# Patient Record
Sex: Female | Born: 1985 | Race: White | Hispanic: No | Marital: Single | Smoking: Current every day smoker
Health system: Southern US, Community
[De-identification: ages and names within clinical notes are randomized; demographics above are authoritative.]

## PROBLEM LIST (undated history)

## (undated) ENCOUNTER — Inpatient Hospital Stay (HOSPITAL_COMMUNITY): Payer: Self-pay

## (undated) DIAGNOSIS — E119 Type 2 diabetes mellitus without complications: Secondary | ICD-10-CM

## (undated) DIAGNOSIS — Z789 Other specified health status: Secondary | ICD-10-CM

## (undated) HISTORY — PX: WISDOM TOOTH EXTRACTION: SHX21

## (undated) HISTORY — DX: Other specified health status: Z78.9

## (undated) HISTORY — PX: DILATION AND CURETTAGE OF UTERUS: SHX78

## (undated) HISTORY — DX: Type 2 diabetes mellitus without complications: E11.9

---
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
## 2006-01-12 ENCOUNTER — Ambulatory Visit: Payer: Self-pay | Admitting: Sports Medicine

## 2006-02-14 ENCOUNTER — Ambulatory Visit: Payer: Self-pay | Admitting: Sports Medicine

## 2006-03-26 ENCOUNTER — Emergency Department (HOSPITAL_COMMUNITY): Admission: EM | Admit: 2006-03-26 | Discharge: 2006-03-26 | Payer: Self-pay | Admitting: Emergency Medicine

## 2006-03-31 ENCOUNTER — Other Ambulatory Visit: Admission: RE | Admit: 2006-03-31 | Discharge: 2006-03-31 | Payer: Self-pay | Admitting: Family Medicine

## 2006-03-31 ENCOUNTER — Ambulatory Visit: Payer: Self-pay | Admitting: Family Medicine

## 2006-03-31 ENCOUNTER — Encounter (INDEPENDENT_AMBULATORY_CARE_PROVIDER_SITE_OTHER): Payer: Self-pay | Admitting: Family Medicine

## 2006-03-31 ENCOUNTER — Encounter (INDEPENDENT_AMBULATORY_CARE_PROVIDER_SITE_OTHER): Payer: Self-pay | Admitting: Specialist

## 2006-03-31 LAB — CONVERTED CEMR LAB
Chlamydia, DNA Probe: NEGATIVE
GC Probe Amp, Genital: NEGATIVE

## 2006-04-28 DIAGNOSIS — J309 Allergic rhinitis, unspecified: Secondary | ICD-10-CM | POA: Insufficient documentation

## 2006-04-28 DIAGNOSIS — G44209 Tension-type headache, unspecified, not intractable: Secondary | ICD-10-CM

## 2006-06-15 ENCOUNTER — Ambulatory Visit: Payer: Self-pay | Admitting: Family Medicine

## 2006-09-22 ENCOUNTER — Encounter (INDEPENDENT_AMBULATORY_CARE_PROVIDER_SITE_OTHER): Payer: Self-pay | Admitting: Family Medicine

## 2006-09-22 ENCOUNTER — Telehealth: Payer: Self-pay | Admitting: *Deleted

## 2006-09-22 ENCOUNTER — Ambulatory Visit: Payer: Self-pay | Admitting: Family Medicine

## 2006-09-22 DIAGNOSIS — N898 Other specified noninflammatory disorders of vagina: Secondary | ICD-10-CM | POA: Insufficient documentation

## 2006-09-22 LAB — CONVERTED CEMR LAB
Chlamydia, DNA Probe: NEGATIVE
Whiff Test: NEGATIVE

## 2006-10-05 ENCOUNTER — Encounter (INDEPENDENT_AMBULATORY_CARE_PROVIDER_SITE_OTHER): Payer: Self-pay | Admitting: Family Medicine

## 2006-11-01 ENCOUNTER — Ambulatory Visit: Payer: Self-pay | Admitting: Family Medicine

## 2006-11-01 DIAGNOSIS — M545 Low back pain: Secondary | ICD-10-CM

## 2006-12-14 ENCOUNTER — Ambulatory Visit: Payer: Self-pay | Admitting: Family Medicine

## 2007-08-21 ENCOUNTER — Encounter (INDEPENDENT_AMBULATORY_CARE_PROVIDER_SITE_OTHER): Payer: Self-pay | Admitting: Family Medicine

## 2007-11-21 ENCOUNTER — Telehealth: Payer: Self-pay | Admitting: *Deleted

## 2007-12-15 ENCOUNTER — Telehealth: Payer: Self-pay | Admitting: *Deleted

## 2007-12-20 ENCOUNTER — Telehealth (INDEPENDENT_AMBULATORY_CARE_PROVIDER_SITE_OTHER): Payer: Self-pay | Admitting: *Deleted

## 2008-02-27 ENCOUNTER — Emergency Department (HOSPITAL_COMMUNITY): Admission: EM | Admit: 2008-02-27 | Discharge: 2008-02-27 | Payer: Self-pay | Admitting: Family Medicine

## 2008-04-17 ENCOUNTER — Telehealth: Payer: Self-pay | Admitting: *Deleted

## 2008-09-05 ENCOUNTER — Encounter: Payer: Self-pay | Admitting: Family Medicine

## 2008-09-05 ENCOUNTER — Encounter (INDEPENDENT_AMBULATORY_CARE_PROVIDER_SITE_OTHER): Payer: Self-pay | Admitting: Family Medicine

## 2008-09-05 ENCOUNTER — Ambulatory Visit: Payer: Self-pay | Admitting: Family Medicine

## 2008-09-05 DIAGNOSIS — N912 Amenorrhea, unspecified: Secondary | ICD-10-CM

## 2008-09-05 DIAGNOSIS — E663 Overweight: Secondary | ICD-10-CM | POA: Insufficient documentation

## 2008-09-05 DIAGNOSIS — F17200 Nicotine dependence, unspecified, uncomplicated: Secondary | ICD-10-CM | POA: Insufficient documentation

## 2008-09-05 LAB — CONVERTED CEMR LAB
HCT: 36.8 % (ref 36.0–46.0)
Hemoglobin: 12.1 g/dL (ref 12.0–15.0)
LDL Cholesterol: 81 mg/dL (ref 0–99)
MCHC: 32.9 g/dL (ref 30.0–36.0)
RDW: 14 % (ref 11.5–15.5)

## 2008-09-06 ENCOUNTER — Encounter: Payer: Self-pay | Admitting: Family Medicine

## 2008-09-09 ENCOUNTER — Encounter: Payer: Self-pay | Admitting: Family Medicine

## 2008-10-08 ENCOUNTER — Telehealth: Payer: Self-pay | Admitting: Family Medicine

## 2008-10-14 ENCOUNTER — Telehealth: Payer: Self-pay | Admitting: Family Medicine

## 2008-10-14 ENCOUNTER — Encounter: Payer: Self-pay | Admitting: Family Medicine

## 2008-10-14 ENCOUNTER — Ambulatory Visit: Payer: Self-pay | Admitting: Family Medicine

## 2008-10-14 DIAGNOSIS — G44229 Chronic tension-type headache, not intractable: Secondary | ICD-10-CM

## 2008-12-03 ENCOUNTER — Ambulatory Visit: Payer: Self-pay | Admitting: Family Medicine

## 2009-01-06 ENCOUNTER — Ambulatory Visit: Payer: Self-pay | Admitting: Family Medicine

## 2009-01-06 DIAGNOSIS — R519 Headache, unspecified: Secondary | ICD-10-CM | POA: Insufficient documentation

## 2009-01-06 DIAGNOSIS — R51 Headache: Secondary | ICD-10-CM

## 2009-05-07 ENCOUNTER — Telehealth: Payer: Self-pay | Admitting: Family Medicine

## 2009-06-12 ENCOUNTER — Ambulatory Visit: Payer: Self-pay | Admitting: Family Medicine

## 2009-06-19 ENCOUNTER — Ambulatory Visit: Payer: Self-pay | Admitting: Family Medicine

## 2009-06-19 ENCOUNTER — Telehealth (INDEPENDENT_AMBULATORY_CARE_PROVIDER_SITE_OTHER): Payer: Self-pay | Admitting: *Deleted

## 2009-06-19 DIAGNOSIS — K089 Disorder of teeth and supporting structures, unspecified: Secondary | ICD-10-CM

## 2010-03-31 NOTE — Assessment & Plan Note (Signed)
Summary: FU/KH   Vital Signs:  Patient profile:   25 year old female Weight:      189.6 pounds Temp:     98 degrees F oral Pulse rate:   89 / minute Pulse rhythm:   regular BP sitting:   130 / 86  (left arm) Cuff size:   regular  Vitals Entered By: Loralee Pacas CMA (June 12, 2009 3:37 PM) Comments pt states that she has increased appetite and weight gain with meds.  also tension headaches on right temple x 3 weeks alleviated with tylenol   Primary Care Provider:  Asher Muir MD   History of Present Illness: 1.  headaches--amitripyline--put on this in november for headaches.  helped the headaches alot, but gained weight.  she thinks about 15 pounds. otherwise was tolerating the medicine well.    Habits & Providers  Alcohol-Tobacco-Diet     Tobacco Status: current     Tobacco Counseling: to quit use of tobacco products     Cigarette Packs/Day: <0.25  Current Medications (verified): 1)  Ortho Tri-Cyclen (28) 0.18/0.215/0.25 Mg-35 Mcg Tabs (Norgestim-Eth Estrad Triphasic) .Marland Kitchen.. 1 Tab By Mouth Daily For Birth Control 2)  Amitriptyline Hcl 25 Mg Tabs (Amitriptyline Hcl) .... Take 1 Tablet At Night For One Week, Then Take 2 Tablets At Night For One Week, Then Take 3 Tablets At Night For The Next 2-3 Months  Allergies: No Known Drug Allergies  Social History: Packs/Day:  <0.25  Review of Systems General:  Denies fever and loss of appetite; wt gain. Neuro:  Denies brief paralysis, disturbances in coordination, and memory loss.  Physical Exam  General:  Well-developed,well-nourished,in no acute distress; alert,appropriate and cooperative throughout examination Neurologic:  alert & oriented X3, cranial nerves II-XII intact, strength normal in all extremities, and finger-to-nose normal.   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  will stop amitryptiline and start propranolol for prophyaxis.    Orders: [facility]- Est Level  3 ([address])  Complete Medication List: 1)  Ortho Tri-cyclen (28) 0.18/0.215/0.25 Mg-35 Mcg Tabs (Norgestim-eth estrad triphasic) .Marland Kitchen.. 1 tab by mouth daily for birth control 2)  Propranolol Hcl 40 Mg Tabs (Propranolol hcl) .... 2 tabs by mouth by mouth two times a day for headache prevention  Patient Instructions: 1)  It was nice to see you today. 2)  For your headaches, STOP the amitryptiline. 3)  START the propranolol I prescribed you.  Take 2 tablets two times a day.   4)  Call me if you have any problems or questions. 5)  Please schedule an appointment for your physical and pap smear in late July. Prescriptions: PROPRANOLOL HCL 40 MG TABS (PROPRANOLOL HCL) 2 tabs by mouth by mouth two times a day for headache prevention  #160 x 6   Entered and Authorized by:   Asher Muir MD   Signed by:   Asher Muir MD on 06/12/2009   Method used:   Print then Give to Patient   RxID:   [id number]   Prevention & Chronic Care Immunizations   Influenza vaccine: Fluvax Non-MCR  (12/03/2008)   Influenza vaccine due: 12/14/2007    Tetanus booster: Not documented    Pneumococcal vaccine: Not documented  Other Screening   Pap smear: NEGATIVE FOR INTRAEPITHELIAL LESIONS OR MALIGNANCY.  (09/05/2008)   Pap smear due: 09/05/2009   Smoking status: current  (06/12/2009)

## 2010-03-31 NOTE — Assessment & Plan Note (Signed)
Summary: Dental pain   Vital Signs:  Patient profile:   25 year old female Weight:      183 pounds  Vitals Entered By: Loralee Pacas CMA (June 19, 2009 3:13 PM)  Primary Care Provider:  Asher Muir MD  CC:  tooth pain.  History of Present Illness: 25yo F w/ tooth pain  Tooth pain: Localized to R upper posterior molar.  States that the tooth broke 2 months ago but has been hurting constantly with sharp pain x 2 weeks.  Currently taking tylenol every 4 hours without significant relief.  No fevers, chills, or drainage from the site.  Pain with chewing so eating has been difficulty.  She has no primary dentist.  Current Medications (verified): 1)  Ortho Tri-Cyclen (28) 0.18/0.215/0.25 Mg-35 Mcg Tabs (Norgestim-Eth Estrad Triphasic) .Marland Kitchen.. 1 Tab By Mouth Daily For Birth Control 2)  Propranolol Hcl 40 Mg Tabs (Propranolol Hcl) .... 2 Tabs By Mouth By Mouth Two Times A Day For Headache Prevention 3)  Ultram 50 Mg Tabs (Tramadol Hcl) .Marland Kitchen.. 1 Tab By Mouth Every 6 Hours As Needed For Breakthrough Pain 4)  Tylenol Extra Strength 500 Mg Tabs (Acetaminophen) .... 2 Tabs By Mouth Q8 Hours Scheduled While Having Tooth Pain 5)  Amoxicillin 500 Mg Tabs (Amoxicillin) .Marland Kitchen.. 1 Tab By Mouth Two Times A Day X 7 Days  Allergies (verified): No Known Drug Allergies  Past History:  Past Medical History: Last updated: 09/05/2008 [id number] - TAB 8/06, 6/10, SVD 8/07 abnormal pap--no invasive procedures--years ago, can't remember when  Review of Systems      See HPI  Physical Exam  General:  VS Reviewed. Well appearing, NAD.  Mouth:  Chipped lateral aspect of the top right posterior molar with moderate signs of dental decay; no surrounding erythema or edema or the gums; no active discharge; no abscess Lungs:  normal respiratory effort, no crackles, and no wheezes or ronchi.   Heart:  normal rate, regular rhythm, no murmur, no gallop, and no rub.     Impression & Recommendations:  Problem # 1:   DENTAL PAIN (ICD-525.9) Assessment New  Dental pain of the top Right posterior molar. No active infection visible and no systemic constitutional symptoms. Plan to cover with amoxicillin. Will treat pain with schedule tylenol 800mg  q8 and ultram 50mg  q6 for breakthrough. Referral sent to urgent dental clinic. Will f/u if she has any signs of infection.  Orders: [facility]- Est Level  3 ([address]86)  Complete Medication List: 1)  Ortho Tri-cyclen (28) 0.18/0.215/0.25 Mg-35 Mcg Tabs (Norgestim-eth estrad triphasic) .Marland Kitchen.. 1 tab by mouth daily for birth control 2)  Propranolol Hcl 40 Mg Tabs (Propranolol hcl) .... 2 tabs by mouth by mouth two times a day for headache prevention 3)  Ultram 50 Mg Tabs (Tramadol hcl) .Marland Kitchen.. 1 tab by mouth every 6 hours as needed for breakthrough pain 4)  Tylenol Extra Strength 500 Mg Tabs (Acetaminophen) .... 2 tabs by mouth q8 hours scheduled while having tooth pain 5)  Amoxicillin 500 Mg Tabs (Amoxicillin) .Marland Kitchen.. 1 tab by mouth two times a day x 7 days  Patient Instructions: 1)  We are referring you to a dentist for further evaluation and treatment.   2)  Return to the clinic if you notice any signs of infection...fevers, chills, pus, drainage, swelling, and redness of the gums. Prescriptions: AMOXICILLIN 500 MG TABS (AMOXICILLIN) 1 tab by mouth two times a day x 7 days  #14 x 0   Entered and Authorized by:  Marisue Ivan  MD   Signed by:   Marisue Ivan  MD on 06/19/2009   Method used:   Print then Give to Patient   RxID:   [id number] ULTRAM 50 MG TABS (TRAMADOL HCL) 1 tab by mouth every 6 hours as needed for breakthrough pain  #30 x 0   Entered and Authorized by:   Marisue Ivan  MD   Signed by:   Marisue Ivan  MD on 06/19/2009   Method used:   Print then Give to Patient   RxID:   [id number]

## 2010-03-31 NOTE — Progress Notes (Signed)
Summary: Rx Req  Phone Note Refill Request Call back at Home Phone [phone] Message from:  Patient  Refills Requested: Medication #1:  AMITRIPTYLINE HCL 25 MG TABS take 1 tablet at night for one week PT USES HEALTH DEPT.  PT HAS F/UP APPT ON 3/15.  Initial call taken by: Clydell Hakim,  May 07, 2009 9:20 AM  Follow-up for Phone Call        will fax script  to health dept. Follow-up by: Asher Muir MD,  May 07, 2009 11:46 AM    Prescriptions: AMITRIPTYLINE HCL 25 MG TABS (AMITRIPTYLINE HCL) take 1 tablet at night for one week, then take 2 tablets at night for one week, then take 3 tablets at night for the next 2-3 months  #90 x 3   Entered and Authorized by:   Asher Muir MD   Signed by:   Asher Muir MD on 05/07/2009   Method used:   Printed then faxed to ...       [facility]  [address] [id number] (retail)       [address]       [place], [place]  [address]       Ph: [phone]       Fax: [phone]   RxID:   [id number]

## 2010-03-31 NOTE — Progress Notes (Signed)
Summary: referral  Phone Note Call from Patient Call back at [phone]   Caller: Patient Summary of Call: wants a referral to Adult Dental Clinic - tooth pain Initial call taken by: De Nurse,  June 19, 2009 10:31 AM  Follow-up for Phone Call        spoke with patient . she broke her tooth 2 months   ago. for 2 weeks she has been having some dental pain , now has worsened. constant pain. appointment scheduled today for [place] appointment today . Follow-up by: Theresia Lo RN,  June 19, 2009 11:06 AM

## 2010-04-13 ENCOUNTER — Encounter: Payer: Self-pay | Admitting: *Deleted

## 2010-12-04 LAB — URINE CULTURE: Colony Count: >=100000

## 2010-12-04 LAB — POCT URINALYSIS DIP (DEVICE)
Bilirubin Urine: NEGATIVE
Glucose, UA: NEGATIVE mg/dL
Protein, ur: NEGATIVE mg/dL
Urobilinogen, UA: 0.2 mg/dL (ref 0.0–1.0)

## 2013-01-04 ENCOUNTER — Emergency Department (HOSPITAL_COMMUNITY): Payer: Self-pay

## 2013-01-04 ENCOUNTER — Emergency Department (HOSPITAL_COMMUNITY)
Admission: EM | Admit: 2013-01-04 | Discharge: 2013-01-04 | Disposition: A | Discharge status: Home / Self Care | Payer: Self-pay | Attending: Emergency Medicine | Admitting: Emergency Medicine

## 2013-01-04 ENCOUNTER — Encounter (HOSPITAL_COMMUNITY): Payer: Self-pay | Admitting: Emergency Medicine

## 2013-01-04 DIAGNOSIS — F17200 Nicotine dependence, unspecified, uncomplicated: Secondary | ICD-10-CM | POA: Insufficient documentation

## 2013-01-04 DIAGNOSIS — E669 Obesity, unspecified: Secondary | ICD-10-CM | POA: Insufficient documentation

## 2013-01-04 DIAGNOSIS — R071 Chest pain on breathing: Secondary | ICD-10-CM | POA: Insufficient documentation

## 2013-01-04 DIAGNOSIS — R059 Cough, unspecified: Secondary | ICD-10-CM | POA: Insufficient documentation

## 2013-01-04 DIAGNOSIS — R000 Tachycardia, unspecified: Secondary | ICD-10-CM | POA: Insufficient documentation

## 2013-01-04 DIAGNOSIS — R05 Cough: Secondary | ICD-10-CM | POA: Insufficient documentation

## 2013-01-04 LAB — CBC WITH DIFFERENTIAL/PLATELET
Basophils Relative: 0 % (ref 0–1)
Eosinophils Relative: 4 % (ref 0–5)
MCHC: 35.5 g/dL (ref 30.0–36.0)
MCV: 85.3 fL (ref 78.0–100.0)
Monocytes Absolute: 0.5 10*3/uL (ref 0.1–1.0)
Neutro Abs: 5.6 10*3/uL (ref 1.7–7.7)
Neutrophils Relative %: 65 % (ref 43–77)
RDW: 13.9 % (ref 11.5–15.5)

## 2013-01-04 LAB — BASIC METABOLIC PANEL
BUN: 9 mg/dL (ref 6–23)
Chloride: 104 mEq/L (ref 96–112)
Glucose, Bld: 129 mg/dL — ABNORMAL HIGH (ref 70–99)
Potassium: 3.5 mEq/L (ref 3.5–5.1)

## 2013-01-04 MED ORDER — KETOROLAC TROMETHAMINE 60 MG/2ML IM SOLN
60.0000 mg | Freq: Once | INTRAMUSCULAR | Status: AC
  Administered 2013-01-04: 60 mg via INTRAMUSCULAR
  Filled 2013-01-04: qty 2

## 2013-01-04 MED ORDER — IBUPROFEN 600 MG PO TABS: 600.0000 mg | ORAL_TABLET | Freq: Four times a day (QID) | ORAL | Status: DC | PRN

## 2013-01-04 MED ORDER — KETOROLAC TROMETHAMINE 30 MG/ML IJ SOLN
30.0000 mg | Freq: Once | INTRAMUSCULAR | Status: DC
  Filled 2013-01-04: qty 1

## 2013-01-04 NOTE — ED Provider Notes (Addendum)
CSN: [id number]     Arrival date & time 01/04/13  0820 History   First MD Initiated Contact with Patient 01/04/13 [phone]     Chief Complaint  Patient presents with  . Muscle Pain   (Consider location/radiation/quality/duration/timing/severity/associated sxs/prior Treatment) HPI Comments: Pt is a 27 y.o. female with Pmhx as above who presents with sharp localized R sided CP since last night, better with arm raised over head, worse w/ arm lowered.  She has had recent URI symptoms, but no fever, SOB, n/v, d/a, leg pain or swelling.  Patient is a 27 y.o. female presenting with chest pain. The history is provided by the patient. No language interpreter was used.  Chest Pain Pain location:  R chest Pain quality: sharp   Pain radiates to:  Does not radiate Pain radiates to the back: no   Pain severity:  Moderate Onset quality:  Sudden Duration:  12 hours Timing:  Constant Progression:  Unchanged Context: at rest   Relieved by: rasing arm over head. Exacerbated by: lowering arm. Associated symptoms: cough   Associated symptoms: no abdominal pain, no back pain, no diaphoresis, no fatigue, no fever, no headache, no lower extremity edema, no nausea, no numbness, no palpitations, no shortness of breath, not vomiting and no weakness   Cough:    Cough characteristics:  Productive   Severity:  Mild   Timing:  Constant   Progression:  Partially resolved   Chronicity:  New Risk factors: obesity and smoking   Risk factors: no aortic disease, no birth control, no coronary artery disease, no diabetes mellitus, no Ehlers-Danlos syndrome, no high cholesterol, no hypertension, not pregnant, no prior DVT/PE and no surgery     History reviewed. No pertinent past medical history. History reviewed. No pertinent past surgical history. History reviewed. No pertinent family history. History  Substance Use Topics  . Smoking status: Current Every Day Smoker -- 0.50 packs/day    Types: Cigarettes  .  Smokeless tobacco: Never Used  . Alcohol Use: No   OB History   Grav Para Term Preterm Abortions TAB SAB Ect Mult Living                 Review of Systems  Constitutional: Negative for fever, chills, diaphoresis, activity change, appetite change and fatigue.  HENT: Negative for congestion, facial swelling, rhinorrhea and sore throat.   Eyes: Negative for photophobia and discharge.  Respiratory: Positive for cough. Negative for chest tightness and shortness of breath.   Cardiovascular: Positive for chest pain. Negative for palpitations and leg swelling.  Gastrointestinal: Negative for nausea, vomiting, abdominal pain and diarrhea.  Endocrine: Negative for polydipsia and polyuria.  Genitourinary: Negative for dysuria, frequency, difficulty urinating and pelvic pain.  Musculoskeletal: Negative for arthralgias, back pain, neck pain and neck stiffness.  Skin: Negative for color change and wound.  Allergic/Immunologic: Negative for immunocompromised state.  Neurological: Negative for facial asymmetry, weakness, numbness and headaches.  Hematological: Does not bruise/bleed easily.  Psychiatric/Behavioral: Negative for confusion and agitation.    Allergies  Review of patient's allergies indicates not on file.  Home Medications   Current Outpatient Rx  Name  Route  Sig  Dispense  Refill  . acetaminophen (TYLENOL) 500 MG tablet   Oral   Take 500 mg by mouth every 8 (eight) hours as needed for mild pain or headache.          . ibuprofen (ADVIL,MOTRIN) 600 MG tablet   Oral   Take 1 tablet (600 mg total) by  mouth every 6 (six) hours as needed.   30 tablet   0    BP 113/67  Pulse 72  Temp(Src) 98.1 F (36.7 C) (Oral)  Resp 18  SpO2 97%  LMP 12/21/2012 Physical Exam  Constitutional: She is oriented to person, place, and time. She appears well-developed and well-nourished. No distress.  HENT:  Head: Normocephalic and atraumatic.  Mouth/Throat: No oropharyngeal exudate.  Eyes:  Pupils are equal, round, and reactive to light.  Neck: Normal range of motion. Neck supple.  Cardiovascular: Normal rate, regular rhythm and normal heart sounds.  Exam reveals no gallop and no friction rub.   No murmur heard. Pulmonary/Chest: Effort normal and breath sounds normal. No respiratory distress. She has no wheezes. She has no rales.    Abdominal: Soft. Bowel sounds are normal. She exhibits no distension and no mass. There is no tenderness. There is no rebound and no guarding.  Musculoskeletal: Normal range of motion. She exhibits no edema and no tenderness.  Neurological: She is alert and oriented to person, place, and time.  Skin: Skin is warm and dry.  Psychiatric: She has a normal mood and affect.    ED Course  Procedures (including critical care time) Labs Review Labs Reviewed  CBC WITH DIFFERENTIAL - Abnormal; Notable for the following:    HCT 35.5 (*)    All other components within normal limits  BASIC METABOLIC PANEL - Abnormal; Notable for the following:    Glucose, Bld 129 (*)    All other components within normal limits  D-DIMER, QUANTITATIVE   Imaging Review Dg Chest 2 View  01/04/2013   CLINICAL DATA:  Chest and right flank pain. , history of tobacco use.  EXAM: CHEST  2 VIEW  COMPARISON:  None.  FINDINGS: The lungs are well-expanded. There is no focal infiltrate. There is no pneumothorax or pneumomediastinum nor pleural effusion. The cardiopericardial silhouette is normal in size. The pulmonary vascularity is not engorged. There is mild mid thoracic dextroscoliosis. The gas pattern in the observed portions of the upper abdomen appears normal.  IMPRESSION: There is no evidence of pneumonia nor CHF or other acute cardiopulmonary abnormality.   Electronically Signed   By: David  [place]   On: 01/04/2013 09:27    EKG Interpretation     Ventricular Rate:  81 PR Interval:  161 QRS Duration: 94 QT Interval:  377 QTC Calculation: 438 R Axis:   69 Text  Interpretation:  Sinus rhythm Probable left atrial enlargement Otherwise normal ECG            MDM   1. Costochondral chest pain    Pt is a 27 y.o. female with Pmhx as above who presents with sharp localized R sided CP since last night, better with arm raised over head, worse w/ arm lowered.  She has had recent URI symptoms, but no fever, SOB, n/v, d/a, leg pain or swelling.  Pt mildly tachycardic on exam, but in NAD.  Localized ttp R lateral chest wall.  EKG unremarkable, CXR negative.  D-dimer negative.  Doubt pna, ptx, ACS, or PE.  I feel pain likely due to costochondritis.  Pt safe for d/c w/ regula ibuprofen use.  Return precautions given for new or worsening symptoms including trouble breathing, fever, leg swelling, worsening pain.         Shanna Cisco, MD 01/04/13 1038  Shanna Cisco, MD 01/12/13 [phone]

## 2013-01-04 NOTE — ED Notes (Signed)
Pt reports to the ED for eval of pain to the right axilla area. Pt denies any known injury. No ecchymosis, swelling, or obvious deformity. Small area of erythema noted but pt denies pain at that site and reports that is where she applied icy hot. Pt denies any CP, SOB, N/V/D, or urinary symptoms. Pt afebrile at this time. Pt reports recent chest cold. Pt reports moving her right arm and palpation make the pain worse. Pt slightly tachycardic in the 100s at this time. Pt A&O x4, skin warm and dry, resp e/u.

## 2013-07-02 ENCOUNTER — Encounter (HOSPITAL_COMMUNITY): Payer: Self-pay | Admitting: Emergency Medicine

## 2013-07-02 ENCOUNTER — Emergency Department (HOSPITAL_COMMUNITY)
Admission: EM | Admit: 2013-07-02 | Discharge: 2013-07-02 | Disposition: A | Discharge status: Home / Self Care | Payer: Self-pay | Attending: Emergency Medicine | Admitting: Emergency Medicine

## 2013-07-02 DIAGNOSIS — R112 Nausea with vomiting, unspecified: Secondary | ICD-10-CM | POA: Insufficient documentation

## 2013-07-02 LAB — CBC WITH DIFFERENTIAL/PLATELET
BASOS ABS: 0 10*3/uL (ref 0.0–0.1)
Basophils Relative: 0 % (ref 0–1)
EOS ABS: 0.2 10*3/uL (ref 0.0–0.7)
Eosinophils Relative: 1 % (ref 0–5)
HCT: 41 % (ref 36.0–46.0)
Hemoglobin: 14.4 g/dL (ref 12.0–15.0)
LYMPHS ABS: 0.7 10*3/uL (ref 0.7–4.0)
LYMPHS PCT: 4 % — AB (ref 12–46)
MCH: 30.3 pg (ref 26.0–34.0)
MCHC: 35.1 g/dL (ref 30.0–36.0)
MCV: 86.1 fL (ref 78.0–100.0)
Monocytes Absolute: 0.8 10*3/uL (ref 0.1–1.0)
Monocytes Relative: 5 % (ref 3–12)
Neutro Abs: 15.9 10*3/uL — ABNORMAL HIGH (ref 1.7–7.7)
Neutrophils Relative %: 90 % — ABNORMAL HIGH (ref 43–77)
Platelets: 253 10*3/uL (ref 150–400)
RBC: 4.76 MIL/uL (ref 3.87–5.11)
RDW: 13.5 % (ref 11.5–15.5)
WBC: 17.7 10*3/uL — AB (ref 4.0–10.5)

## 2013-07-02 LAB — COMPREHENSIVE METABOLIC PANEL
ALK PHOS: 83 U/L (ref 39–117)
ALT: 10 U/L (ref 0–35)
AST: 15 U/L (ref 0–37)
Albumin: 4.5 g/dL (ref 3.5–5.2)
BUN: 18 mg/dL (ref 6–23)
CALCIUM: 9.8 mg/dL (ref 8.4–10.5)
CO2: 15 meq/L — AB (ref 19–32)
Chloride: 105 mEq/L (ref 96–112)
Creatinine, Ser: 0.6 mg/dL (ref 0.50–1.10)
GFR calc Af Amer: >90 mL/min (ref >90)
GFR calc non Af Amer: >90 mL/min (ref >90)
Glucose, Bld: 138 mg/dL — ABNORMAL HIGH (ref 70–99)
Potassium: 4.1 mEq/L (ref 3.7–5.3)
SODIUM: 141 meq/L (ref 137–147)
TOTAL PROTEIN: 8.2 g/dL (ref 6.0–8.3)
Total Bilirubin: 0.6 mg/dL (ref 0.3–1.2)

## 2013-07-02 LAB — LIPASE, BLOOD: Lipase: 27 U/L (ref 11–59)

## 2013-07-02 MED ORDER — ONDANSETRON 4 MG PO TBDP
8.0000 mg | ORAL_TABLET | Freq: Once | ORAL | Status: AC
  Administered 2013-07-02: 8 mg via ORAL
  Filled 2013-07-02: qty 2

## 2013-07-02 NOTE — ED Notes (Signed)
Per pt sts abdominal pain, N,V since this am. sts pain in navel area. sts LBM watery. Denies vaginal and urinary symptoms.

## 2013-07-02 NOTE — ED Notes (Signed)
Name called x 3 - no answer 

## 2013-07-02 NOTE — ED Notes (Signed)
Name called no answer x 2 

## 2015-11-13 ENCOUNTER — Encounter: Payer: Self-pay | Admitting: Obstetrics and Gynecology

## 2015-11-13 ENCOUNTER — Ambulatory Visit (INDEPENDENT_AMBULATORY_CARE_PROVIDER_SITE_OTHER): Payer: Medicaid Other | Admitting: Obstetrics and Gynecology

## 2015-11-13 ENCOUNTER — Other Ambulatory Visit (HOSPITAL_COMMUNITY)
Admission: RE | Admit: 2015-11-13 | Discharge: 2015-11-13 | Disposition: A | Discharge status: Home / Self Care | Payer: Medicaid Other | Source: Ambulatory Visit | Attending: Obstetrics and Gynecology | Admitting: Obstetrics and Gynecology

## 2015-11-13 VITALS — BP 130/81 | HR 86 | Wt 188.0 lb

## 2015-11-13 DIAGNOSIS — Z3491 Encounter for supervision of normal pregnancy, unspecified, first trimester: Secondary | ICD-10-CM

## 2015-11-13 DIAGNOSIS — Z36 Encounter for antenatal screening of mother: Secondary | ICD-10-CM

## 2015-11-13 DIAGNOSIS — Z124 Encounter for screening for malignant neoplasm of cervix: Secondary | ICD-10-CM | POA: Diagnosis not present

## 2015-11-13 DIAGNOSIS — F17200 Nicotine dependence, unspecified, uncomplicated: Secondary | ICD-10-CM

## 2015-11-13 DIAGNOSIS — Z01419 Encounter for gynecological examination (general) (routine) without abnormal findings: Secondary | ICD-10-CM | POA: Insufficient documentation

## 2015-11-13 DIAGNOSIS — Z3481 Encounter for supervision of other normal pregnancy, first trimester: Secondary | ICD-10-CM | POA: Diagnosis not present

## 2015-11-13 DIAGNOSIS — Z113 Encounter for screening for infections with a predominantly sexual mode of transmission: Secondary | ICD-10-CM | POA: Insufficient documentation

## 2015-11-13 DIAGNOSIS — O0990 Supervision of high risk pregnancy, unspecified, unspecified trimester: Secondary | ICD-10-CM | POA: Insufficient documentation

## 2015-11-13 DIAGNOSIS — Z23 Encounter for immunization: Secondary | ICD-10-CM

## 2015-11-13 DIAGNOSIS — O99331 Smoking (tobacco) complicating pregnancy, first trimester: Secondary | ICD-10-CM | POA: Diagnosis not present

## 2015-11-13 NOTE — Progress Notes (Signed)
Abdominal US performed at bedside, SIUP noted with +FHR=158 and CRL measuring 12w 2d.

## 2015-11-13 NOTE — Addendum Note (Signed)
Addended by: Gita KudoLASSITER, KRISTEN S on: 11/13/2015 10:07 AM   Modules accepted: Orders

## 2015-11-13 NOTE — Progress Notes (Signed)
New OB Note  11/13/2015   Clinic: [facility]  Chief Complaint: NOB  Transfer of Care Patient: no  History of Present Illness: Ms. Jocelyn Potts is a 30 y.o. [id number] @ 11/2 weeks (EDC 4-3, based on Patient's last menstrual period was 08/26/2015 (exact date).=12wk u/s bedside u/s today), with the above CC. Preg complicated by has OVERWEIGHT; TOBACCO ABUSE; TENSION HEADACHE; CHRONIC TENSION HEADACHE; RHINITIS, ALLERGIC; DENTAL PAIN; LOW BACK PAIN, MILD; HEADACHE, REBOUND; and Supervision of normal pregnancy in first trimester on her problem list.   Her periods were: regular, qmonth She was using no method when she conceived.  She has Negative signs or symptoms of nausea/vomiting of pregnancy. She has Negative signs or symptoms of miscarriage or preterm labor She identifies Negative Zika risk factors for her and her partner On any different medications around the time she conceived/early pregnancy: No   ROS: A 12-point review of systems was performed and negative, except as stated in the above HPI.  OBGYN History: As per HPI. OB History  Gravida Para Term Preterm AB Living  4 1 1   2 1   SAB TAB Ectopic Multiple Live Births    2     1    # Outcome Date GA Lbr Len/2nd Weight Sex Delivery Anes PTL Lv  4 Current           3 TAB 2010 [redacted]w[redacted]d         2 Term 10/29/05 [redacted]w[redacted]d  7 lb 7 oz (3.374 kg) F Vag-Spont  N LIV  1 TAB 2006 [redacted]w[redacted]d             Any issues with any prior pregnancies: no Any prior children are healthy, doing well, without any problems or issues: yes History of pap smears: No. Last pap smear unknown.  History of STIs: No   Past Medical History: Past Medical History:  Diagnosis Date  . Medical history non-contributory     Past Surgical History: Past Surgical History:  Procedure Laterality Date  . DILATION AND CURETTAGE OF UTERUS     Therapeutic Abortion x2    Family History:  Family History  Problem Relation Age of Onset  . Asthma Mother    . Hypertension Father   . Cancer Maternal Grandmother     lung  . Cancer Paternal Grandfather     Bone   She denies any female cancers, bleeding or blood clotting disorders.  She denies any history of mental retardation, birth defects or genetic disorders in her or the FOB's history  Social History:  Social History   Social History  . Marital status: Single    Spouse name: N/A  . Number of children: N/A  . Years of education: N/A   Occupational History  . Not on file.   Social History Main Topics  . Smoking status: Current Every Day Smoker    Packs/day: 0.25    Types: Cigarettes  . Smokeless tobacco: Never Used  . Alcohol use No  . Drug use: No  . Sexual activity: Yes    Birth control/ protection: None   Other Topics Concern  . Not on file   Social History Narrative  . No narrative on file   Works at [employer]  Allergy: No Known Allergies  Health Maintenance:  Mammogram Up to Date: not applicable  Current Outpatient Medications: PNV  Physical Exam:   BP 130/81   Pulse 86   Wt 188 lb (85.3 kg)   LMP 08/26/2015 (Exact Date)  BMI 29.44 kg/m  Body mass index is 29.44 kg/m.  General appearance: Well nourished, well developed female in no acute distress.  Neck:  Supple, normal appearance, and no thyromegaly  Cardiovascular: S1, S2 normal, no murmur, rub or gallop, regular rate and rhythm Respiratory:  Clear to auscultation bilateral. Normal respiratory effort Abdomen: positive bowel sounds and no masses, hernias; diffusely non tender to palpation, non distended Breasts: breasts appear normal, no suspicious masses, no skin or nipple changes or axillary nodes, and normal inspection. Neuro/Psych:  Normal mood and affect.  Skin:  Warm and dry.  Lymphatic:  No inguinal lymphadenopathy.   Pelvic exam: is not limited by body habitus EGBUS: within normal limits with mild erythema and some cottage white d/c at introitus, Vagina: within normal limits and with no  blood in the vault, Cervix: normal appearing cervix without discharge or lesions, closed/long/high, Uterus:  enlarged, c/w 12 week size, and Adnexa:  normal adnexa and no mass, fullness, tenderness  Laboratory: none  Imaging:  BSUS today: SLIUP with CRL c/w 12/2 weeks  Assessment: pt doing well  Plan: 1. Supervision of normal pregnancy in first trimester Routine care. NOB labs today and pt amenable to 1st trimester screening - Prenatal Profile - Culture, OB Urine - Cytology - PAP - Pain Mgmt, Profile 6 Conf w/o mM, U - Cystic fibrosis diagnostic study - US MFM Fetal Nuchal Translucency; Future - Hemoglobinopathy Evaluation  2. Tobacco Counseled pt on risks of tobacco use during pregnancy. She has cut down a lot and encouraged pt to continue.   Problem list reviewed and updated.  Follow up in 4 weeks.  >50% of 20 min visit spent on counseling and coordination of care.     Cornelia Copaharlie Jenniger Figiel, Jr. MD Attending [facility](Faculty Practice)

## 2015-11-14 ENCOUNTER — Encounter: Payer: Self-pay | Admitting: *Deleted

## 2015-11-14 LAB — PRENATAL PROFILE (SOLSTAS)
Antibody Screen: NEGATIVE
BASOS PCT: 0 %
Basophils Absolute: 0 cells/uL (ref 0–200)
EOS ABS: 176 {cells}/uL (ref 15–500)
Eosinophils Relative: 2 %
HEMATOCRIT: 35 % (ref 35.0–45.0)
HEP B S AG: NEGATIVE
HIV: NONREACTIVE
Hemoglobin: 11.6 g/dL — ABNORMAL LOW (ref 11.7–15.5)
LYMPHS PCT: 20 %
Lymphs Abs: 1760 cells/uL (ref 850–3900)
MCH: 30.4 pg (ref 27.0–33.0)
MCHC: 33.1 g/dL (ref 32.0–36.0)
MCV: 91.9 fL (ref 80.0–100.0)
MONO ABS: 528 {cells}/uL (ref 200–950)
MPV: 10.8 fL (ref 7.5–12.5)
Monocytes Relative: 6 %
NEUTROS ABS: 6336 {cells}/uL (ref 1500–7800)
Neutrophils Relative %: 72 %
Platelets: 233 10*3/uL (ref 140–400)
RBC: 3.81 MIL/uL (ref 3.80–5.10)
RDW: 13.5 % (ref 11.0–15.0)
RH TYPE: POSITIVE
Rubella: 2.99 Index — ABNORMAL HIGH (ref <0.90)
WBC: 8.8 10*3/uL (ref 3.8–10.8)

## 2015-11-14 LAB — CYTOLOGY - PAP

## 2015-11-15 LAB — CULTURE, OB URINE
Colony Count: NO GROWTH
ORGANISM ID, BACTERIA: NO GROWTH

## 2015-11-16 LAB — PAIN MGMT, PROFILE 6 CONF W/O MM, U
6 ACETYLMORPHINE: NEGATIVE ng/mL (ref <10)
ALCOHOL METABOLITES: NEGATIVE ng/mL (ref <500)
Amphetamines: NEGATIVE ng/mL (ref <500)
Barbiturates: NEGATIVE ng/mL (ref <300)
Benzodiazepines: NEGATIVE ng/mL (ref <100)
COCAINE METABOLITE: NEGATIVE ng/mL (ref <150)
Creatinine: 128.5 mg/dL (ref >=20.0)
MARIJUANA METABOLITE: 254 ng/mL — AB (ref <5)
Marijuana Metabolite: POSITIVE ng/mL — AB (ref <20)
Methadone Metabolite: NEGATIVE ng/mL (ref <100)
OXYCODONE: NEGATIVE ng/mL (ref <100)
Opiates: NEGATIVE ng/mL (ref <100)
Oxidant: NEGATIVE ug/mL (ref <200)
PH: 7.92 (ref 4.5–9.0)
PHENCYCLIDINE: NEGATIVE ng/mL (ref <25)
PLEASE NOTE: 0

## 2015-11-17 ENCOUNTER — Encounter: Payer: Self-pay | Admitting: Obstetrics and Gynecology

## 2015-11-17 DIAGNOSIS — F1210 Cannabis abuse, uncomplicated: Secondary | ICD-10-CM | POA: Insufficient documentation

## 2015-11-17 LAB — HEMOGLOBINOPATHY EVALUATION
HCT: 35 % (ref 35.0–45.0)
HEMOGLOBIN: 11.6 g/dL — AB (ref 11.7–15.5)
Hgb A2 Quant: 2.7 % (ref 1.8–3.5)
Hgb A: 96.3 % (ref >96.0)
Hgb F Quant: <1 % (ref <2.0)
MCH: 30.4 pg (ref 27.0–33.0)
MCV: 91.9 fL (ref 80.0–100.0)
RBC: 3.81 MIL/uL (ref 3.80–5.10)
RDW: 13.5 % (ref 11.0–15.0)

## 2015-11-18 LAB — CYSTIC FIBROSIS DIAGNOSTIC STUDY

## 2015-11-26 ENCOUNTER — Other Ambulatory Visit: Payer: Self-pay | Admitting: Obstetrics and Gynecology

## 2015-11-26 ENCOUNTER — Encounter (HOSPITAL_COMMUNITY): Payer: Self-pay

## 2015-11-26 ENCOUNTER — Ambulatory Visit (HOSPITAL_COMMUNITY)
Admission: RE | Admit: 2015-11-26 | Discharge: 2015-11-26 | Disposition: A | Discharge status: Home / Self Care | Payer: Medicaid Other | Source: Ambulatory Visit | Attending: Obstetrics and Gynecology | Admitting: Obstetrics and Gynecology

## 2015-11-26 DIAGNOSIS — Z3491 Encounter for supervision of normal pregnancy, unspecified, first trimester: Secondary | ICD-10-CM

## 2015-11-26 DIAGNOSIS — Z36 Encounter for antenatal screening of mother: Secondary | ICD-10-CM | POA: Insufficient documentation

## 2015-11-26 DIAGNOSIS — Z3A14 14 weeks gestation of pregnancy: Secondary | ICD-10-CM

## 2015-11-26 DIAGNOSIS — Z1389 Encounter for screening for other disorder: Secondary | ICD-10-CM

## 2015-11-27 ENCOUNTER — Other Ambulatory Visit (HOSPITAL_COMMUNITY): Payer: Self-pay | Admitting: *Deleted

## 2015-11-27 DIAGNOSIS — Z3689 Encounter for other specified antenatal screening: Secondary | ICD-10-CM

## 2015-12-09 ENCOUNTER — Ambulatory Visit (INDEPENDENT_AMBULATORY_CARE_PROVIDER_SITE_OTHER): Payer: Medicaid Other | Admitting: Family Medicine

## 2015-12-09 VITALS — BP 114/78 | HR 89 | Wt 192.0 lb

## 2015-12-09 DIAGNOSIS — Z3481 Encounter for supervision of other normal pregnancy, first trimester: Secondary | ICD-10-CM

## 2015-12-09 NOTE — Progress Notes (Signed)
Current c/o constipation and round ligament pain. Pt to begin Miralax bid until no longer constipated then decrease dose to once daily for maintenance.

## 2015-12-09 NOTE — Progress Notes (Signed)
   PRENATAL VISIT NOTE  Subjective:  Jocelyn Potts is a 30 y.o. [phone] at 5277w0d being seen today for ongoing prenatal care.  She is currently monitored for the following issues for this low-risk pregnancy and has OVERWEIGHT; TOBACCO ABUSE; TENSION HEADACHE; CHRONIC TENSION HEADACHE; RHINITIS, ALLERGIC; DENTAL PAIN; LOW BACK PAIN, MILD; HEADACHE, REBOUND; Supervision of normal pregnancy in first trimester; and Marijuana abuse on her problem list.  Patient reports no complaints.  Contractions: Not present. Vag. Bleeding: None.  Movement: Absent. Denies leaking of fluid.   The following portions of the patient's history were reviewed and updated as appropriate: allergies, current medications, past family history, past medical history, past social history, past surgical history and problem list. Problem list updated.  Objective:   Vitals:   12/09/15 0857  BP: 114/78  Pulse: 89  Weight: 192 lb (87.1 kg)    Fetal Status: Fetal Heart Rate (bpm): 142   Movement: Absent     General:  Alert, oriented and cooperative. Patient is in no acute distress.  Skin: Skin is warm and dry. No rash noted.   Cardiovascular: Normal heart rate noted  Respiratory: Normal respiratory effort, no problems with respiration noted  Abdomen: Soft, gravid, appropriate for gestational age. Pain/Pressure: Absent     Pelvic:  Cervical exam deferred        Extremities: Normal range of motion.  Edema: None  Mental Status: Normal mood and affect. Normal behavior. Normal judgment and thought content.   Urinalysis:      Assessment and Plan:  Pregnancy: G4P1021 at 7377w0d  1. Encounter for supervision of other normal pregnancy in first trimester Scheduled for anatomy Place in [facility]. - AFP, Quad Screen  General obstetric precautions including but not limited to vaginal bleeding, contractions, leaking of fluid and fetal movement were reviewed in detail with the patient. Please refer to After Visit Summary for  other counseling recommendations.  Return in 4 weeks (on 01/06/2016).  Reva Boresanya S Kazimir Hartnett, MD

## 2015-12-09 NOTE — Patient Instructions (Signed)
Second Trimester of Pregnancy The second trimester is from week 13 through week 28, months 4 through 6. The second trimester is often a time when you feel your best. Your body has also adjusted to being pregnant, and you begin to feel better physically. Usually, morning sickness has lessened or quit completely, you may have more energy, and you may have an increase in appetite. The second trimester is also a time when the fetus is growing rapidly. At the end of the sixth month, the fetus is about 9 inches long and weighs about 1 pounds. You will likely begin to feel the baby move (quickening) between 18 and 20 weeks of the pregnancy. BODY CHANGES Your body goes through many changes during pregnancy. The changes vary from woman to woman.   Your weight will continue to increase. You will notice your lower abdomen bulging out.  You may begin to get stretch marks on your hips, abdomen, and breasts.  You may develop headaches that can be relieved by medicines approved by your health care provider.  You may urinate more often because the fetus is pressing on your bladder.  You may develop or continue to have heartburn as a result of your pregnancy.  You may develop constipation because certain hormones are causing the muscles that push waste through your intestines to slow down.  You may develop hemorrhoids or swollen, bulging veins (varicose veins).  You may have back pain because of the weight gain and pregnancy hormones relaxing your joints between the bones in your pelvis and as a result of a shift in weight and the muscles that support your balance.  Your breasts will continue to grow and be tender.  Your gums may bleed and may be sensitive to brushing and flossing.  Dark spots or blotches (chloasma, mask of pregnancy) may develop on your face. This will likely fade after the baby is born.  A dark line from your belly button to the pubic area (linea nigra) may appear. This will likely  fade after the baby is born.  You may have changes in your hair. These can include thickening of your hair, rapid growth, and changes in texture. Some women also have hair loss during or after pregnancy, or hair that feels dry or thin. Your hair will most likely return to normal after your baby is born. WHAT TO EXPECT AT YOUR PRENATAL VISITS During a routine prenatal visit:  You will be weighed to make sure you and the fetus are growing normally.  Your blood pressure will be taken.  Your abdomen will be measured to track your baby's growth.  The fetal heartbeat will be listened to.  Any test results from the previous visit will be discussed. Your health care provider may ask you:  How you are feeling.  If you are feeling the baby move.  If you have had any abnormal symptoms, such as leaking fluid, bleeding, severe headaches, or abdominal cramping.  If you are using any tobacco products, including cigarettes, chewing tobacco, and electronic cigarettes.  If you have any questions. Other tests that may be performed during your second trimester include:  Blood tests that check for:  Low iron levels (anemia).  Gestational diabetes (between 24 and 28 weeks).  Rh antibodies.  Urine tests to check for infections, diabetes, or protein in the urine.  An ultrasound to confirm the proper growth and development of the baby.  An amniocentesis to check for possible genetic problems.  Fetal screens for spina bifida   and Down syndrome.  HIV (human immunodeficiency virus) testing. Routine prenatal testing includes screening for HIV, unless you choose not to have this test. HOME CARE INSTRUCTIONS   Avoid all smoking, herbs, alcohol, and unprescribed drugs. These chemicals affect the formation and growth of the baby.  Do not use any tobacco products, including cigarettes, chewing tobacco, and electronic cigarettes. If you need help quitting, ask your health care provider. You may receive  counseling support and other resources to help you quit.  Follow your health care provider's instructions regarding medicine use. There are medicines that are either safe or unsafe to take during pregnancy.  Exercise only as directed by your health care provider. Experiencing uterine cramps is a good sign to stop exercising.  Continue to eat regular, healthy meals.  Wear a good support bra for breast tenderness.  Do not use hot tubs, steam rooms, or saunas.  Wear your seat belt at all times when driving.  Avoid raw meat, uncooked cheese, cat litter boxes, and soil used by cats. These carry germs that can cause birth defects in the baby.  Take your prenatal vitamins.  Take 1500-2000 mg of calcium daily starting at the 20th week of pregnancy until you deliver your baby.  Try taking a stool softener (if your health care provider approves) if you develop constipation. Eat more high-fiber foods, such as fresh vegetables or fruit and whole grains. Drink plenty of fluids to keep your urine clear or pale yellow.  Take warm sitz baths to soothe any pain or discomfort caused by hemorrhoids. Use hemorrhoid cream if your health care provider approves.  If you develop varicose veins, wear support hose. Elevate your feet for 15 minutes, 3-4 times a day. Limit salt in your diet.  Avoid heavy lifting, wear low heel shoes, and practice good posture.  Rest with your legs elevated if you have leg cramps or low back pain.  Visit your dentist if you have not gone yet during your pregnancy. Use a soft toothbrush to brush your teeth and be gentle when you floss.  A sexual relationship may be continued unless your health care provider directs you otherwise.  Continue to go to all your prenatal visits as directed by your health care provider. SEEK MEDICAL CARE IF:   You have dizziness.  You have mild pelvic cramps, pelvic pressure, or nagging pain in the abdominal area.  You have persistent nausea,  vomiting, or diarrhea.  You have a bad smelling vaginal discharge.  You have pain with urination. SEEK IMMEDIATE MEDICAL CARE IF:   You have a fever.  You are leaking fluid from your vagina.  You have spotting or bleeding from your vagina.  You have severe abdominal cramping or pain.  You have rapid weight gain or loss.  You have shortness of breath with chest pain.  You notice sudden or extreme swelling of your face, hands, ankles, feet, or legs.  You have not felt your baby move in over an hour.  You have severe headaches that do not go away with medicine.  You have vision changes.   This information is not intended to replace advice given to you by your health care provider. Make sure you discuss any questions you have with your health care provider.   Document Released: 02/09/2001 Document Revised: 03/08/2014 Document Reviewed: 04/18/2012 Elsevier Interactive Patient Education 2016 [employer]   Breastfeeding Deciding to breastfeed is one of the best choices you can make for you and your baby. A change   in hormones during pregnancy causes your breast tissue to grow and increases the number and size of your milk ducts. These hormones also allow proteins, sugars, and fats from your blood supply to make breast milk in your milk-producing glands. Hormones prevent breast milk from being released before your baby is born as well as prompt milk flow after birth. Once breastfeeding has begun, thoughts of your baby, as well as his or her sucking or crying, can stimulate the release of milk from your milk-producing glands.  BENEFITS OF BREASTFEEDING For Your Baby  Your first milk (colostrum) helps your baby's digestive system function better.  There are antibodies in your milk that help your baby fight off infections.  Your baby has a lower incidence of asthma, allergies, and sudden infant death syndrome.  The nutrients in breast milk are better for your baby than infant  formulas and are designed uniquely for your baby's needs.  Breast milk improves your baby's brain development.  Your baby is less likely to develop other conditions, such as childhood obesity, asthma, or type 2 diabetes mellitus. For You  Breastfeeding helps to create a very special bond between you and your baby.  Breastfeeding is convenient. Breast milk is always available at the correct temperature and costs nothing.  Breastfeeding helps to burn calories and helps you lose the weight gained during pregnancy.  Breastfeeding makes your uterus contract to its prepregnancy size faster and slows bleeding (lochia) after you give birth.   Breastfeeding helps to lower your risk of developing type 2 diabetes mellitus, osteoporosis, and breast or ovarian cancer later in life. SIGNS THAT YOUR BABY IS HUNGRY Early Signs of Hunger  Increased alertness or activity.  Stretching.  Movement of the head from side to side.  Movement of the head and opening of the mouth when the corner of the mouth or cheek is stroked (rooting).  Increased sucking sounds, smacking lips, cooing, sighing, or squeaking.  Hand-to-mouth movements.  Increased sucking of fingers or hands. Late Signs of Hunger  Fussing.  Intermittent crying. Extreme Signs of Hunger Signs of extreme hunger will require calming and consoling before your baby will be able to breastfeed successfully. Do not wait for the following signs of extreme hunger to occur before you initiate breastfeeding:  Restlessness.  A loud, strong cry.  Screaming. BREASTFEEDING BASICS Breastfeeding Initiation  Find a comfortable place to sit or lie down, with your neck and back well supported.  Place a pillow or rolled up blanket under your baby to bring him or her to the level of your breast (if you are seated). Nursing pillows are specially designed to help support your arms and your baby while you breastfeed.  Make sure that your baby's  abdomen is facing your abdomen.  Gently massage your breast. With your fingertips, massage from your chest wall toward your nipple in a circular motion. This encourages milk flow. You may need to continue this action during the feeding if your milk flows slowly.  Support your breast with 4 fingers underneath and your thumb above your nipple. Make sure your fingers are well away from your nipple and your baby's mouth.  Stroke your baby's lips gently with your finger or nipple.  When your baby's mouth is open wide enough, quickly bring your baby to your breast, placing your entire nipple and as much of the colored area around your nipple (areola) as possible into your baby's mouth.  More areola should be visible above your baby's upper lip than   below the lower lip.  Your baby's tongue should be between his or her lower gum and your breast.  Ensure that your baby's mouth is correctly positioned around your nipple (latched). Your baby's lips should create a seal on your breast and be turned out (everted).  It is common for your baby to suck about 2-3 minutes in order to start the flow of breast milk. Latching Teaching your baby how to latch on to your breast properly is very important. An improper latch can cause nipple pain and decreased milk supply for you and poor weight gain in your baby. Also, if your baby is not latched onto your nipple properly, he or she may swallow some air during feeding. This can make your baby fussy. Burping your baby when you switch breasts during the feeding can help to get rid of the air. However, teaching your baby to latch on properly is still the best way to prevent fussiness from swallowing air while breastfeeding. Signs that your baby has successfully latched on to your nipple:  Silent tugging or silent sucking, without causing you pain.  Swallowing heard between every 3-4 sucks.  Muscle movement above and in front of his or her ears while sucking. Signs  that your baby has not successfully latched on to nipple:  Sucking sounds or smacking sounds from your baby while breastfeeding.  Nipple pain. If you think your baby has not latched on correctly, slip your finger into the corner of your baby's mouth to break the suction and place it between your baby's gums. Attempt breastfeeding initiation again. Signs of Successful Breastfeeding Signs from your baby:  A gradual decrease in the number of sucks or complete cessation of sucking.  Falling asleep.  Relaxation of his or her body.  Retention of a small amount of milk in his or her mouth.  Letting go of your breast by himself or herself. Signs from you:  Breasts that have increased in firmness, weight, and size 1-3 hours after feeding.  Breasts that are softer immediately after breastfeeding.  Increased milk volume, as well as a change in milk consistency and color by the fifth day of breastfeeding.  Nipples that are not sore, cracked, or bleeding. Signs That Your Baby is Getting Enough Milk  Wetting at least 3 diapers in a 24-hour period. The urine should be clear and pale yellow by age 5 days.  At least 3 stools in a 24-hour period by age 5 days. The stool should be soft and yellow.  At least 3 stools in a 24-hour period by age 7 days. The stool should be seedy and yellow.  No loss of weight greater than 10% of birth weight during the first 3 days of age.  Average weight gain of 4-7 ounces (113-198 g) per week after age 4 days.  Consistent daily weight gain by age 5 days, without weight loss after the age of 2 weeks. After a feeding, your baby may spit up a small amount. This is common. BREASTFEEDING FREQUENCY AND DURATION Frequent feeding will help you make more milk and can prevent sore nipples and breast engorgement. Breastfeed when you feel the need to reduce the fullness of your breasts or when your baby shows signs of hunger. This is called "breastfeeding on demand." Avoid  introducing a pacifier to your baby while you are working to establish breastfeeding (the first 4-6 weeks after your baby is born). After this time you may choose to use a pacifier. Research has shown that   pacifier use during the first year of a baby's life decreases the risk of sudden infant death syndrome (SIDS). Allow your baby to feed on each breast as long as he or she wants. Breastfeed until your baby is finished feeding. When your baby unlatches or falls asleep while feeding from the first breast, offer the second breast. Because newborns are often sleepy in the first few weeks of life, you may need to awaken your baby to get him or her to feed. Breastfeeding times will vary from baby to baby. However, the following rules can serve as a guide to help you ensure that your baby is properly fed:  Newborns (babies 4 weeks of age or younger) may breastfeed every 1-3 hours.  Newborns should not go longer than 3 hours during the day or 5 hours during the night without breastfeeding.  You should breastfeed your baby a minimum of 8 times in a 24-hour period until you begin to introduce solid foods to your baby at around 6 months of age. BREAST MILK PUMPING Pumping and storing breast milk allows you to ensure that your baby is exclusively fed your breast milk, even at times when you are unable to breastfeed. This is especially important if you are going back to work while you are still breastfeeding or when you are not able to be present during feedings. Your lactation consultant can give you guidelines on how long it is safe to store breast milk. A breast pump is a machine that allows you to pump milk from your breast into a sterile bottle. The pumped breast milk can then be stored in a refrigerator or freezer. Some breast pumps are operated by hand, while others use electricity. Ask your lactation consultant which type will work best for you. Breast pumps can be purchased, but some hospitals and  breastfeeding support groups lease breast pumps on a monthly basis. A lactation consultant can teach you how to hand express breast milk, if you prefer not to use a pump. CARING FOR YOUR BREASTS WHILE YOU BREASTFEED Nipples can become dry, cracked, and sore while breastfeeding. The following recommendations can help keep your breasts moisturized and healthy:  Avoid using soap on your nipples.  Wear a supportive bra. Although not required, special nursing bras and tank tops are designed to allow access to your breasts for breastfeeding without taking off your entire bra or top. Avoid wearing underwire-style bras or extremely tight bras.  Air dry your nipples for 3-4minutes after each feeding.  Use only cotton bra pads to absorb leaked breast milk. Leaking of breast milk between feedings is normal.  Use lanolin on your nipples after breastfeeding. Lanolin helps to maintain your skin's normal moisture barrier. If you use pure lanolin, you do not need to wash it off before feeding your baby again. Pure lanolin is not toxic to your baby. You may also hand express a few drops of breast milk and gently massage that milk into your nipples and allow the milk to air dry. In the first few weeks after giving birth, some women experience extremely full breasts (engorgement). Engorgement can make your breasts feel heavy, warm, and tender to the touch. Engorgement peaks within 3-5 days after you give birth. The following recommendations can help ease engorgement:  Completely empty your breasts while breastfeeding or pumping. You may want to start by applying warm, moist heat (in the shower or with warm water-soaked hand towels) just before feeding or pumping. This increases circulation and helps the milk   flow. If your baby does not completely empty your breasts while breastfeeding, pump any extra milk after he or she is finished.  Wear a snug bra (nursing or regular) or tank top for 1-2 days to signal your body  to slightly decrease milk production.  Apply ice packs to your breasts, unless this is too uncomfortable for you.  Make sure that your baby is latched on and positioned properly while breastfeeding. If engorgement persists after 48 hours of following these recommendations, contact your health care provider or a lactation consultant. OVERALL HEALTH CARE RECOMMENDATIONS WHILE BREASTFEEDING  Eat healthy foods. Alternate between meals and snacks, eating 3 of each per day. Because what you eat affects your breast milk, some of the foods may make your baby more irritable than usual. Avoid eating these foods if you are sure that they are negatively affecting your baby.  Drink milk, fruit juice, and water to satisfy your thirst (about 10 glasses a day).  Rest often, relax, and continue to take your prenatal vitamins to prevent fatigue, stress, and anemia.  Continue breast self-awareness checks.  Avoid chewing and smoking tobacco. Chemicals from cigarettes that pass into breast milk and exposure to secondhand smoke may harm your baby.  Avoid alcohol and drug use, including marijuana. Some medicines that may be harmful to your baby can pass through breast milk. It is important to ask your health care provider before taking any medicine, including all over-the-counter and prescription medicine as well as vitamin and herbal supplements. It is possible to become pregnant while breastfeeding. If birth control is desired, ask your health care provider about options that will be safe for your baby. SEEK MEDICAL CARE IF:  You feel like you want to stop breastfeeding or have become frustrated with breastfeeding.  You have painful breasts or nipples.  Your nipples are cracked or bleeding.  Your breasts are red, tender, or warm.  You have a swollen area on either breast.  You have a fever or chills.  You have nausea or vomiting.  You have drainage other than breast milk from your nipples.  Your  breasts do not become full before feedings by the fifth day after you give birth.  You feel sad and depressed.  Your baby is too sleepy to eat well.  Your baby is having trouble sleeping.   Your baby is wetting less than 3 diapers in a 24-hour period.  Your baby has less than 3 stools in a 24-hour period.  Your baby's skin or the white part of his or her eyes becomes yellow.   Your baby is not gaining weight by 5 days of age. SEEK IMMEDIATE MEDICAL CARE IF:  Your baby is overly tired (lethargic) and does not want to wake up and feed.  Your baby develops an unexplained fever.   This information is not intended to replace advice given to you by your health care provider. Make sure you discuss any questions you have with your health care provider.   Document Released: 02/15/2005 Document Revised: 11/06/2014 Document Reviewed: 08/09/2012 Elsevier Interactive Patient Education 2016 [employer] Inc[employer]  

## 2015-12-10 LAB — AFP, QUAD SCREEN
AFP: 36.4 ng/mL
Age Alone: 1:781 {titer}
Curr Gest Age: 16 weeks
HCG, Total: 30.02 IU/mL
INH: 285.9 pg/mL
INTERPRETATION-AFP: NEGATIVE
MoM for AFP: 1.26
MoM for INH: 2
MoM for hCG: 0.87
Open Spina bifida: NEGATIVE
Tri 18 Scr Risk Est: NEGATIVE
Trisomy 18 (Edward) Syndrome Interp.: 1:62500 {titer}
uE3 Mom: 1.04
uE3 Value: 0.78 ng/mL

## 2015-12-31 ENCOUNTER — Other Ambulatory Visit (HOSPITAL_COMMUNITY): Payer: Self-pay | Admitting: Maternal and Fetal Medicine

## 2015-12-31 ENCOUNTER — Ambulatory Visit (HOSPITAL_COMMUNITY)
Admission: RE | Admit: 2015-12-31 | Discharge: 2015-12-31 | Disposition: A | Discharge status: Home / Self Care | Payer: Medicaid Other | Source: Ambulatory Visit | Attending: Obstetrics and Gynecology | Admitting: Obstetrics and Gynecology

## 2015-12-31 DIAGNOSIS — Z3689 Encounter for other specified antenatal screening: Secondary | ICD-10-CM

## 2015-12-31 DIAGNOSIS — Z3A19 19 weeks gestation of pregnancy: Secondary | ICD-10-CM | POA: Insufficient documentation

## 2016-01-08 ENCOUNTER — Ambulatory Visit (INDEPENDENT_AMBULATORY_CARE_PROVIDER_SITE_OTHER): Payer: Medicaid Other | Admitting: Obstetrics & Gynecology

## 2016-01-08 DIAGNOSIS — Z3481 Encounter for supervision of other normal pregnancy, first trimester: Secondary | ICD-10-CM

## 2016-01-08 DIAGNOSIS — O99212 Obesity complicating pregnancy, second trimester: Secondary | ICD-10-CM

## 2016-01-08 DIAGNOSIS — E669 Obesity, unspecified: Secondary | ICD-10-CM

## 2016-01-08 DIAGNOSIS — Z3482 Encounter for supervision of other normal pregnancy, second trimester: Secondary | ICD-10-CM | POA: Diagnosis not present

## 2016-01-08 DIAGNOSIS — O99210 Obesity complicating pregnancy, unspecified trimester: Secondary | ICD-10-CM | POA: Insufficient documentation

## 2016-01-08 LAB — GLUCOSE, RANDOM: GLUCOSE: 85 mg/dL (ref 65–99)

## 2016-01-08 LAB — HEMOGLOBIN A1C
HEMOGLOBIN A1C: 4.8 % (ref <5.7)
MEAN PLASMA GLUCOSE: 91 mg/dL

## 2016-01-08 NOTE — Progress Notes (Signed)
   PRENATAL VISIT NOTE  Subjective:  Jocelyn Potts is a 30 y.o. [phone] at 2754w2d being seen today for ongoing prenatal care.  She is currently monitored for the following issues for this low-risk pregnancy and has OVERWEIGHT; TOBACCO ABUSE; TENSION HEADACHE; CHRONIC TENSION HEADACHE; RHINITIS, ALLERGIC; DENTAL PAIN; LOW BACK PAIN, MILD; Supervision of normal pregnancy in first trimester; Marijuana abuse; and Obesity in pregnancy on her problem list.  Patient reports no complaints.  Contractions: Not present. Vag. Bleeding: None.  Movement: Absent. Denies leaking of fluid.   The following portions of the patient's history were reviewed and updated as appropriate: allergies, current medications, past family history, past medical history, past social history, past surgical history and problem list. Problem list updated.  Objective:   Vitals:   01/08/16 1003  BP: 119/75  Pulse: (!) 103  Weight: 200 lb (90.7 kg)    Fetal Status: Fetal Heart Rate (bpm): 152   Movement: Absent     General:  Alert, oriented and cooperative. Patient is in no acute distress.  Skin: Skin is warm and dry. No rash noted.   Cardiovascular: Normal heart rate noted  Respiratory: Normal respiratory effort, no problems with respiration noted  Abdomen: Soft, gravid, appropriate for gestational age. Pain/Pressure: Present     Pelvic:  Cervical exam deferred        Extremities: Normal range of motion.  Edema: None  Mental Status: Normal mood and affect. Normal behavior. Normal judgment and thought content.   Assessment and Plan:  Pregnancy: G4P1021 at 6954w2d  1. Encounter for supervision of other normal pregnancy in first trimester  - US MFM OB FOLLOW UP; Future  2. Obesity in pregnancy   Preterm labor symptoms and general obstetric precautions including but not limited to vaginal bleeding, contractions, leaking of fluid and fetal movement were reviewed in detail with the patient. Please refer to After Visit  Summary for other counseling recommendations.  No Follow-up on file.   Allie BossierMyra C Shaneta Cervenka, MD

## 2016-01-08 NOTE — Addendum Note (Signed)
Addended by: Gita KudoLASSITER, KRISTEN S on: 01/08/2016 10:22 AM   Modules accepted: Orders

## 2016-02-12 ENCOUNTER — Telehealth: Payer: Self-pay | Admitting: *Deleted

## 2016-02-12 DIAGNOSIS — Z3491 Encounter for supervision of normal pregnancy, unspecified, first trimester: Secondary | ICD-10-CM

## 2016-02-12 MED ORDER — PRENATAL VITAMIN 27-0.8 MG PO TABS: 1.0000 | ORAL_TABLET | Freq: Every day | ORAL | 11 refills | Status: DC

## 2016-02-12 NOTE — Telephone Encounter (Signed)
-----   Message from Olevia BowensJacinda S Battle sent at 02/11/2016  5:26 PM EST ----- Regarding: Rx Request Contact: [phone] Wants an RX for prenatal vitamins  Uses [facility] on [address]

## 2016-02-19 ENCOUNTER — Encounter (HOSPITAL_COMMUNITY): Payer: Self-pay

## 2016-02-19 ENCOUNTER — Other Ambulatory Visit: Payer: Self-pay | Admitting: Obstetrics & Gynecology

## 2016-02-19 ENCOUNTER — Ambulatory Visit (HOSPITAL_COMMUNITY)
Admission: RE | Admit: 2016-02-19 | Discharge: 2016-02-19 | Disposition: A | Discharge status: Home / Self Care | Payer: Medicaid Other | Source: Ambulatory Visit | Attending: Obstetrics & Gynecology | Admitting: Obstetrics & Gynecology

## 2016-02-19 DIAGNOSIS — Z3A26 26 weeks gestation of pregnancy: Secondary | ICD-10-CM

## 2016-02-19 DIAGNOSIS — O99212 Obesity complicating pregnancy, second trimester: Secondary | ICD-10-CM | POA: Diagnosis not present

## 2016-02-19 DIAGNOSIS — O4440 Low lying placenta NOS or without hemorrhage, unspecified trimester: Secondary | ICD-10-CM

## 2016-02-19 DIAGNOSIS — Z3482 Encounter for supervision of other normal pregnancy, second trimester: Secondary | ICD-10-CM | POA: Insufficient documentation

## 2016-02-19 DIAGNOSIS — Z3481 Encounter for supervision of other normal pregnancy, first trimester: Secondary | ICD-10-CM

## 2016-02-19 DIAGNOSIS — O4442 Low lying placenta NOS or without hemorrhage, second trimester: Secondary | ICD-10-CM | POA: Diagnosis not present

## 2016-03-01 NOTE — L&D Delivery Note (Signed)
Vaginal Delivery Note  31 y.o. [id number] at 1330w2d delivered a viable female infant at 1537 in cephalic, compound left arm LOA position. Loose nuchal cord x1 easily reduced. Right anterior shoulder delivered with ease. Sixty sec delayed cord clamping. Cord clamped x2 and cut. Placenta delivered spontaneously intact, with 3VC. Fundus firm on exam with massage and pitocin.  Mother: Anesthesia: Epidural Laceration: None Suture repair: N/A EBL: 150 mL  Baby: Apgars: 9, 9 Weight: Pending Cord pH: Not sent  Good hemostasis noted. Mom to postpartum.  Baby to Couplet care / Skin to Skin.  Wendee Beaversavid J McMullen, DO, PGY-1 05/20/2016, 4:15 PM  OB FELLOW DELIVERY ATTESTATION  I was gloved and present for the delivery in its entirety, and I agree with the above resident's note.    Ernestina PennaNicholas Schenk, MD 4:22 PM

## 2016-03-04 ENCOUNTER — Ambulatory Visit (INDEPENDENT_AMBULATORY_CARE_PROVIDER_SITE_OTHER): Payer: Medicaid Other | Admitting: Obstetrics and Gynecology

## 2016-03-04 ENCOUNTER — Encounter: Payer: Self-pay | Admitting: *Deleted

## 2016-03-04 VITALS — BP 111/70 | HR 91 | Wt 212.0 lb

## 2016-03-04 DIAGNOSIS — Z23 Encounter for immunization: Secondary | ICD-10-CM | POA: Diagnosis not present

## 2016-03-04 DIAGNOSIS — Z3491 Encounter for supervision of normal pregnancy, unspecified, first trimester: Secondary | ICD-10-CM

## 2016-03-04 DIAGNOSIS — O99213 Obesity complicating pregnancy, third trimester: Secondary | ICD-10-CM | POA: Diagnosis not present

## 2016-03-04 DIAGNOSIS — O99210 Obesity complicating pregnancy, unspecified trimester: Secondary | ICD-10-CM

## 2016-03-04 DIAGNOSIS — E669 Obesity, unspecified: Secondary | ICD-10-CM

## 2016-03-04 NOTE — Progress Notes (Signed)
   PRENATAL VISIT NOTE  Subjective:  Jocelyn Potts is a 31 y.o. [phone] at 5947w2d being seen today for ongoing prenatal care.  She is currently monitored for the following issues for this low-risk pregnancy and has OVERWEIGHT; TOBACCO ABUSE; TENSION HEADACHE; CHRONIC TENSION HEADACHE; RHINITIS, ALLERGIC; DENTAL PAIN; LOW BACK PAIN, MILD; Supervision of normal pregnancy in first trimester; Marijuana abuse; and Obesity in pregnancy on her problem list.  Patient reports no complaints.  Contractions: Not present. Vag. Bleeding: None.  Movement: Present. Denies leaking of fluid.   The following portions of the patient's history were reviewed and updated as appropriate: allergies, current medications, past family history, past medical history, past social history, past surgical history and problem list. Problem list updated.  Objective:   Vitals:   03/04/16 0831  BP: 111/70  Pulse: 91  Weight: 212 lb (96.2 kg)    Fetal Status: Fetal Heart Rate (bpm): 146 Fundal Height: 28 cm Movement: Present     General:  Alert, oriented and cooperative. Patient is in no acute distress.  Skin: Skin is warm and dry. No rash noted.   Cardiovascular: Normal heart rate noted  Respiratory: Normal respiratory effort, no problems with respiration noted  Abdomen: Soft, gravid, appropriate for gestational age. Pain/Pressure: Present     Pelvic:  Cervical exam deferred        Extremities: Normal range of motion.  Edema: None  Mental Status: Normal mood and affect. Normal behavior. Normal judgment and thought content.   Assessment and Plan:  Pregnancy: G4P1021 at 8347w2d  1. Encounter for supervision of normal pregnancy in first trimester, unspecified gravidity Patient is doing well without complaints Third trimester labs, glucola and tdap today Reviewed latest ultrasound results with the patient - Tdap vaccine greater than or equal to 7yo IM - Glucose Tolerance, 2 Hours w/1 Hour - HIV antibody - RPR -  CBC  2. Obesity in pregnancy   Preterm labor symptoms and general obstetric precautions including but not limited to vaginal bleeding, contractions, leaking of fluid and fetal movement were reviewed in detail with the patient. Please refer to After Visit Summary for other counseling recommendations.  Return in about 4 weeks (around 04/01/2016).   Catalina AntiguaPeggy Ticara Waner, MD

## 2016-03-04 NOTE — Progress Notes (Signed)
[employer] Compliant. BTL signed today

## 2016-03-05 LAB — GLUCOSE TOLERANCE, 2 HOURS W/ 1HR
GLUCOSE, 1 HOUR: 137 mg/dL (ref 65–179)
GLUCOSE, 2 HOUR: 134 mg/dL (ref 65–152)
Glucose, Fasting: 136 mg/dL — ABNORMAL HIGH (ref 65–91)

## 2016-03-05 LAB — CBC
HEMOGLOBIN: 10.3 g/dL — AB (ref 11.1–15.9)
Hematocrit: 29.7 % — ABNORMAL LOW (ref 34.0–46.6)
MCH: 31.2 pg (ref 26.6–33.0)
MCHC: 34.7 g/dL (ref 31.5–35.7)
MCV: 90 fL (ref 79–97)
Platelets: 262 10*3/uL (ref 150–379)
RBC: 3.3 x10E6/uL — AB (ref 3.77–5.28)
RDW: 13.6 % (ref 12.3–15.4)
WBC: 14.5 10*3/uL — ABNORMAL HIGH (ref 3.4–10.8)

## 2016-03-05 LAB — RPR: RPR Ser Ql: NONREACTIVE

## 2016-03-05 LAB — HIV ANTIBODY (ROUTINE TESTING W REFLEX): HIV Screen 4th Generation wRfx: NONREACTIVE

## 2016-03-06 ENCOUNTER — Other Ambulatory Visit: Payer: Self-pay | Admitting: Obstetrics and Gynecology

## 2016-03-06 ENCOUNTER — Encounter: Payer: Self-pay | Admitting: Obstetrics and Gynecology

## 2016-03-06 DIAGNOSIS — O24410 Gestational diabetes mellitus in pregnancy, diet controlled: Secondary | ICD-10-CM

## 2016-03-06 DIAGNOSIS — O24419 Gestational diabetes mellitus in pregnancy, unspecified control: Secondary | ICD-10-CM | POA: Insufficient documentation

## 2016-03-06 MED ORDER — ACCU-CHEK NANO SMARTVIEW W/DEVICE KIT: 1.0000 | PACK | 0 refills | Status: DC

## 2016-03-06 MED ORDER — ACCU-CHEK FASTCLIX LANCETS MISC: 1.0000 [IU] | Freq: Four times a day (QID) | 12 refills | Status: DC

## 2016-03-06 MED ORDER — GLUCOSE BLOOD VI STRP: ORAL_STRIP | 12 refills | Status: DC

## 2016-03-08 ENCOUNTER — Telehealth: Payer: Self-pay | Admitting: *Deleted

## 2016-03-08 DIAGNOSIS — O24419 Gestational diabetes mellitus in pregnancy, unspecified control: Secondary | ICD-10-CM

## 2016-03-08 NOTE — Telephone Encounter (Signed)
-----   Message from Catalina AntiguaPeggy Constant, MD sent at 03/06/2016  7:58 AM EST ----- Please inform patient of failed 2 hr glucola and thus diagnosis of GDM. She should be referred to diabetic educator and bring her testing supplies with her (all orders are in EPIC). She does not qualify for babyRx  Thanks  Peggy

## 2016-03-08 NOTE — Telephone Encounter (Signed)
Informed pt of results and recommendation, sent referral to [facility] to schedule appointment and informed pt to take monitor and supplies to that appt when it is made, rescheduled OB f/u.

## 2016-03-18 ENCOUNTER — Encounter: Payer: Medicaid Other | Admitting: Obstetrics and Gynecology

## 2016-03-22 ENCOUNTER — Encounter: Payer: Medicaid Other | Attending: Obstetrics and Gynecology | Admitting: Skilled Nursing Facility1

## 2016-03-22 ENCOUNTER — Ambulatory Visit (INDEPENDENT_AMBULATORY_CARE_PROVIDER_SITE_OTHER): Payer: Medicaid Other | Admitting: Family Medicine

## 2016-03-22 VITALS — BP 137/75 | HR 94 | Wt 212.0 lb

## 2016-03-22 DIAGNOSIS — Z3491 Encounter for supervision of normal pregnancy, unspecified, first trimester: Secondary | ICD-10-CM

## 2016-03-22 DIAGNOSIS — O24913 Unspecified diabetes mellitus in pregnancy, third trimester: Secondary | ICD-10-CM

## 2016-03-22 DIAGNOSIS — O24419 Gestational diabetes mellitus in pregnancy, unspecified control: Secondary | ICD-10-CM

## 2016-03-22 DIAGNOSIS — K089 Disorder of teeth and supporting structures, unspecified: Secondary | ICD-10-CM

## 2016-03-22 DIAGNOSIS — Z713 Dietary counseling and surveillance: Secondary | ICD-10-CM | POA: Diagnosis not present

## 2016-03-22 MED ORDER — GLUCOSE BLOOD VI STRP: ORAL_STRIP | 12 refills | Status: DC

## 2016-03-22 MED ORDER — ACCU-CHEK FASTCLIX LANCETS MISC: 1.0000 [IU] | Freq: Four times a day (QID) | 12 refills | Status: DC

## 2016-03-22 MED ORDER — FUTURO ABDOMINAL SUPPORT MISC: 1.0000 | 0 refills | Status: DC | PRN

## 2016-03-22 NOTE — Patient Instructions (Signed)
Breastfeeding Deciding to breastfeed is one of the best choices you can make for you and your baby. A change in hormones during pregnancy causes your breast tissue to grow and increases the number and size of your milk ducts. These hormones also allow proteins, sugars, and fats from your blood supply to make breast milk in your milk-producing glands. Hormones prevent breast milk from being released before your baby is born as well as prompt milk flow after birth. Once breastfeeding has begun, thoughts of your baby, as well as his or her sucking or crying, can stimulate the release of milk from your milk-producing glands. Benefits of breastfeeding For Your Baby  Your first milk (colostrum) helps your baby's digestive system function better.  There are antibodies in your milk that help your baby fight off infections.  Your baby has a lower incidence of asthma, allergies, and sudden infant death syndrome.  The nutrients in breast milk are better for your baby than infant formulas and are designed uniquely for your baby's needs.  Breast milk improves your baby's brain development.  Your baby is less likely to develop other conditions, such as childhood obesity, asthma, or type 2 diabetes mellitus.  For You  Breastfeeding helps to create a very special bond between you and your baby.  Breastfeeding is convenient. Breast milk is always available at the correct temperature and costs nothing.  Breastfeeding helps to burn calories and helps you lose the weight gained during pregnancy.  Breastfeeding makes your uterus contract to its prepregnancy size faster and slows bleeding (lochia) after you give birth.  Breastfeeding helps to lower your risk of developing type 2 diabetes mellitus, osteoporosis, and breast or ovarian cancer later in life.  Signs that your baby is hungry Early Signs of Hunger  Increased alertness or activity.  Stretching.  Movement of the head from side to  side.  Movement of the head and opening of the mouth when the corner of the mouth or cheek is stroked (rooting).  Increased sucking sounds, smacking lips, cooing, sighing, or squeaking.  Hand-to-mouth movements.  Increased sucking of fingers or hands.  Late Signs of Hunger  Fussing.  Intermittent crying.  Extreme Signs of Hunger Signs of extreme hunger will require calming and consoling before your baby will be able to breastfeed successfully. Do not wait for the following signs of extreme hunger to occur before you initiate breastfeeding:  Restlessness.  A loud, strong cry.  Screaming.  Breastfeeding basics Breastfeeding Initiation  Find a comfortable place to sit or lie down, with your neck and back well supported.  Place a pillow or rolled up blanket under your baby to bring him or her to the level of your breast (if you are seated). Nursing pillows are specially designed to help support your arms and your baby while you breastfeed.  Make sure that your baby's abdomen is facing your abdomen.  Gently massage your breast. With your fingertips, massage from your chest wall toward your nipple in a circular motion. This encourages milk flow. You may need to continue this action during the feeding if your milk flows slowly.  Support your breast with 4 fingers underneath and your thumb above your nipple. Make sure your fingers are well away from your nipple and your baby's mouth.  Stroke your baby's lips gently with your finger or nipple.  When your baby's mouth is open wide enough, quickly bring your baby to your breast, placing your entire nipple and as much of the colored area   around your nipple (areola) as possible into your baby's mouth. ? More areola should be visible above your baby's upper lip than below the lower lip. ? Your baby's tongue should be between his or her lower gum and your breast.  Ensure that your baby's mouth is correctly positioned around your nipple  (latched). Your baby's lips should create a seal on your breast and be turned out (everted).  It is common for your baby to suck about 2-3 minutes in order to start the flow of breast milk.  Latching Teaching your baby how to latch on to your breast properly is very important. An improper latch can cause nipple pain and decreased milk supply for you and poor weight gain in your baby. Also, if your baby is not latched onto your nipple properly, he or she may swallow some air during feeding. This can make your baby fussy. Burping your baby when you switch breasts during the feeding can help to get rid of the air. However, teaching your baby to latch on properly is still the best way to prevent fussiness from swallowing air while breastfeeding. Signs that your baby has successfully latched on to your nipple:  Silent tugging or silent sucking, without causing you pain.  Swallowing heard between every 3-4 sucks.  Muscle movement above and in front of his or her ears while sucking.  Signs that your baby has not successfully latched on to nipple:  Sucking sounds or smacking sounds from your baby while breastfeeding.  Nipple pain.  If you think your baby has not latched on correctly, slip your finger into the corner of your baby's mouth to break the suction and place it between your baby's gums. Attempt breastfeeding initiation again. Signs of Successful Breastfeeding Signs from your baby:  A gradual decrease in the number of sucks or complete cessation of sucking.  Falling asleep.  Relaxation of his or her body.  Retention of a small amount of milk in his or her mouth.  Letting go of your breast by himself or herself.  Signs from you:  Breasts that have increased in firmness, weight, and size 1-3 hours after feeding.  Breasts that are softer immediately after breastfeeding.  Increased milk volume, as well as a change in milk consistency and color by the fifth day of  breastfeeding.  Nipples that are not sore, cracked, or bleeding.  Signs That Your Baby is Getting Enough Milk  Wetting at least 1-2 diapers during the first 24 hours after birth.  Wetting at least 5-6 diapers every 24 hours for the first week after birth. The urine should be clear or pale yellow by 5 days after birth.  Wetting 6-8 diapers every 24 hours as your baby continues to grow and develop.  At least 3 stools in a 24-hour period by age 5 days. The stool should be soft and yellow.  At least 3 stools in a 24-hour period by age 7 days. The stool should be seedy and yellow.  No loss of weight greater than 10% of birth weight during the first 3 days of age.  Average weight gain of 4-7 ounces (113-198 g) per week after age 4 days.  Consistent daily weight gain by age 5 days, without weight loss after the age of 2 weeks.  After a feeding, your baby may spit up a small amount. This is common. Breastfeeding frequency and duration Frequent feeding will help you make more milk and can prevent sore nipples and breast engorgement. Breastfeed when   you feel the need to reduce the fullness of your breasts or when your baby shows signs of hunger. This is called "breastfeeding on demand." Avoid introducing a pacifier to your baby while you are working to establish breastfeeding (the first 4-6 weeks after your baby is born). After this time you may choose to use a pacifier. Research has shown that pacifier use during the first year of a baby's life decreases the risk of sudden infant death syndrome (SIDS). Allow your baby to feed on each breast as long as he or she wants. Breastfeed until your baby is finished feeding. When your baby unlatches or falls asleep while feeding from the first breast, offer the second breast. Because newborns are often sleepy in the first few weeks of life, you may need to awaken your baby to get him or her to feed. Breastfeeding times will vary from baby to baby. However,  the following rules can serve as a guide to help you ensure that your baby is properly fed:  Newborns (babies 4 weeks of age or younger) may breastfeed every 1-3 hours.  Newborns should not go longer than 3 hours during the day or 5 hours during the night without breastfeeding.  You should breastfeed your baby a minimum of 8 times in a 24-hour period until you begin to introduce solid foods to your baby at around 6 months of age.  Breast milk pumping Pumping and storing breast milk allows you to ensure that your baby is exclusively fed your breast milk, even at times when you are unable to breastfeed. This is especially important if you are going back to work while you are still breastfeeding or when you are not able to be present during feedings. Your lactation consultant can give you guidelines on how long it is safe to store breast milk. A breast pump is a machine that allows you to pump milk from your breast into a sterile bottle. The pumped breast milk can then be stored in a refrigerator or freezer. Some breast pumps are operated by hand, while others use electricity. Ask your lactation consultant which type will work best for you. Breast pumps can be purchased, but some hospitals and breastfeeding support groups lease breast pumps on a monthly basis. A lactation consultant can teach you how to hand express breast milk, if you prefer not to use a pump. Caring for your breasts while you breastfeed Nipples can become dry, cracked, and sore while breastfeeding. The following recommendations can help keep your breasts moisturized and healthy:  Avoid using soap on your nipples.  Wear a supportive bra. Although not required, special nursing bras and tank tops are designed to allow access to your breasts for breastfeeding without taking off your entire bra or top. Avoid wearing underwire-style bras or extremely tight bras.  Air dry your nipples for 3-4minutes after each feeding.  Use only cotton  bra pads to absorb leaked breast milk. Leaking of breast milk between feedings is normal.  Use lanolin on your nipples after breastfeeding. Lanolin helps to maintain your skin's normal moisture barrier. If you use pure lanolin, you do not need to wash it off before feeding your baby again. Pure lanolin is not toxic to your baby. You may also hand express a few drops of breast milk and gently massage that milk into your nipples and allow the milk to air dry.  In the first few weeks after giving birth, some women experience extremely full breasts (engorgement). Engorgement can make your   breasts feel heavy, warm, and tender to the touch. Engorgement peaks within 3-5 days after you give birth. The following recommendations can help ease engorgement:  Completely empty your breasts while breastfeeding or pumping. You may want to start by applying warm, moist heat (in the shower or with warm water-soaked hand towels) just before feeding or pumping. This increases circulation and helps the milk flow. If your baby does not completely empty your breasts while breastfeeding, pump any extra milk after he or she is finished.  Wear a snug bra (nursing or regular) or tank top for 1-2 days to signal your body to slightly decrease milk production.  Apply ice packs to your breasts, unless this is too uncomfortable for you.  Make sure that your baby is latched on and positioned properly while breastfeeding.  If engorgement persists after 48 hours of following these recommendations, contact your health care provider or a lactation consultant. Overall health care recommendations while breastfeeding  Eat healthy foods. Alternate between meals and snacks, eating 3 of each per day. Because what you eat affects your breast milk, some of the foods may make your baby more irritable than usual. Avoid eating these foods if you are sure that they are negatively affecting your baby.  Drink milk, fruit juice, and water to  satisfy your thirst (about 10 glasses a day).  Rest often, relax, and continue to take your prenatal vitamins to prevent fatigue, stress, and anemia.  Continue breast self-awareness checks.  Avoid chewing and smoking tobacco. Chemicals from cigarettes that pass into breast milk and exposure to secondhand smoke may harm your baby.  Avoid alcohol and drug use, including marijuana. Some medicines that may be harmful to your baby can pass through breast milk. It is important to ask your health care provider before taking any medicine, including all over-the-counter and prescription medicine as well as vitamin and herbal supplements. It is possible to become pregnant while breastfeeding. If birth control is desired, ask your health care provider about options that will be safe for your baby. Contact a health care provider if:  You feel like you want to stop breastfeeding or have become frustrated with breastfeeding.  You have painful breasts or nipples.  Your nipples are cracked or bleeding.  Your breasts are red, tender, or warm.  You have a swollen area on either breast.  You have a fever or chills.  You have nausea or vomiting.  You have drainage other than breast milk from your nipples.  Your breasts do not become full before feedings by the fifth day after you give birth.  You feel sad and depressed.  Your baby is too sleepy to eat well.  Your baby is having trouble sleeping.  Your baby is wetting less than 3 diapers in a 24-hour period.  Your baby has less than 3 stools in a 24-hour period.  Your baby's skin or the white part of his or her eyes becomes yellow.  Your baby is not gaining weight by 5 days of age. Get help right away if:  Your baby is overly tired (lethargic) and does not want to wake up and feed.  Your baby develops an unexplained fever. This information is not intended to replace advice given to you by your health care provider. Make sure you discuss  any questions you have with your health care provider. Document Released: 02/15/2005 Document Revised: 07/30/2015 Document Reviewed: 08/09/2012 Elsevier Interactive Patient Education  2017 [employer]  

## 2016-03-22 NOTE — Progress Notes (Signed)
Ordered pregnancy support belt and reordered test strips and lancets. Pt went to N&D education today and was made aware she would need to log CBG's to bring to her appointments. She has not been logging them until today.

## 2016-03-23 ENCOUNTER — Encounter: Payer: Self-pay | Admitting: Skilled Nursing Facility1

## 2016-03-23 NOTE — Progress Notes (Signed)
  Patient was seen on 03/22/2016 for Gestational Diabetes self-management class at [facility]. The following learning objectives were met by the patient during this course:   States the definition of Gestational Diabetes  States why dietary management is important in controlling blood glucose  Describes the effects each nutrient has on blood glucose levels  Demonstrates ability to create a balanced meal plan  Demonstrates carbohydrate counting   States when to check blood glucose levels involving a total of 4 separate occurences in a day  Demonstrates proper blood glucose monitoring techniques  States the effect of stress and exercise on blood glucose levels  States the importance of limiting caffeine and abstaining from alcohol and smoking  Demonstrates the knowledge the glucometer provided in class may not be covered by their insurance and to call their insurance provider immediately after class to know which glucometer their insurance provider does cover as well as calling their physician the next day for a prescription to the glucometer their insurance does cover (if the one provided is not) as well as the lancets and strips for that meter.  Blood glucose monitor given: pt already had her own Blood glucose reading: 88  Patient instructed to monitor glucose levels: FBS: 60 - <90 1 hour: <140 2 hour: <120  *Patient received handouts:  Nutrition Diabetes and Pregnancy  Carbohydrate Counting List  Patient will be seen for follow-up as needed.

## 2016-03-23 NOTE — Progress Notes (Signed)
   PRENATAL VISIT NOTE  Subjective:  Jocelyn Potts is a 31 y.o. [phone] at 2073w0d being seen today for ongoing prenatal care.  She is currently monitored for the following issues for this high-risk pregnancy and has OVERWEIGHT; TOBACCO ABUSE; TENSION HEADACHE; CHRONIC TENSION HEADACHE; RHINITIS, ALLERGIC; Disorder of teeth and supporting structures; LOW BACK PAIN, MILD; Supervision of normal pregnancy in first trimester; Marijuana abuse; Obesity in pregnancy; and Gestational diabetes mellitus (GDM) affecting pregnancy, antepartum on her problem list.  Patient reports loose tooth.  Contractions: Not present. Vag. Bleeding: None.  Movement: Present. Denies leaking of fluid.   The following portions of the patient's history were reviewed and updated as appropriate: allergies, current medications, past family history, past medical history, past social history, past surgical history and problem list. Problem list updated.  Objective:   Vitals:   03/22/16 1558  BP: 137/75  Pulse: 94  Weight: 212 lb (96.2 kg)    Fetal Status: Fetal Heart Rate (bpm): 140 Fundal Height: 30 cm Movement: Present     General:  Alert, oriented and cooperative. Patient is in no acute distress.  Skin: Skin is warm and dry. No rash noted.   Cardiovascular: Normal heart rate noted  Respiratory: Normal respiratory effort, no problems with respiration noted  Abdomen: Soft, gravid, appropriate for gestational age. Pain/Pressure: Present     Pelvic:  Cervical exam deferred        Extremities: Normal range of motion.  Edema: None  Mental Status: Normal mood and affect. Normal behavior. Normal judgment and thought content.   Assessment and Plan:  Pregnancy: G4P1021 at 6873w0d  1. Gestational diabetes mellitus (GDM) affecting pregnancy, antepartum Re-emphasized diet, BS checking.  2. Encounter for supervision of normal pregnancy in first trimester, unspecified gravidity Continue prenatal care.   3. Disorder of teeth  and supporting structures Has a loose tooth in back and would like f/u - Ambulatory referral to Dentistry  Preterm labor symptoms and general obstetric precautions including but not limited to vaginal bleeding, contractions, leaking of fluid and fetal movement were reviewed in detail with the patient. Please refer to After Visit Summary for other counseling recommendations.  Return in 2 weeks (on 04/05/2016).   Reva Boresanya S Landis Cassaro, MD

## 2016-04-01 ENCOUNTER — Encounter: Payer: Medicaid Other | Admitting: Obstetrics & Gynecology

## 2016-04-02 ENCOUNTER — Ambulatory Visit (INDEPENDENT_AMBULATORY_CARE_PROVIDER_SITE_OTHER): Payer: Medicaid Other | Admitting: Obstetrics and Gynecology

## 2016-04-02 VITALS — BP 111/71 | HR 92 | Wt 209.0 lb

## 2016-04-02 DIAGNOSIS — O99210 Obesity complicating pregnancy, unspecified trimester: Secondary | ICD-10-CM

## 2016-04-02 DIAGNOSIS — E669 Obesity, unspecified: Secondary | ICD-10-CM

## 2016-04-02 DIAGNOSIS — Z3481 Encounter for supervision of other normal pregnancy, first trimester: Secondary | ICD-10-CM

## 2016-04-02 DIAGNOSIS — O24419 Gestational diabetes mellitus in pregnancy, unspecified control: Secondary | ICD-10-CM

## 2016-04-02 DIAGNOSIS — O99213 Obesity complicating pregnancy, third trimester: Secondary | ICD-10-CM

## 2016-04-02 NOTE — Progress Notes (Signed)
   PRENATAL VISIT NOTE  Subjective:  Jocelyn Potts is a 31 y.o. [phone] at 5435w3d being seen today for ongoing prenatal care.  She is currently monitored for the following issues for this high-risk pregnancy and has OVERWEIGHT; TOBACCO ABUSE; TENSION HEADACHE; CHRONIC TENSION HEADACHE; RHINITIS, ALLERGIC; Disorder of teeth and supporting structures; LOW BACK PAIN, MILD; Supervision of normal pregnancy in first trimester; Marijuana abuse; Obesity in pregnancy; Gestational diabetes mellitus (GDM) affecting pregnancy, antepartum; and Gestational diabetes mellitus (GDM) affecting pregnancy on her problem list.  Patient reports no complaints.  Contractions: Irritability. Vag. Bleeding: None.  Movement: Present. Denies leaking of fluid.   The following portions of the patient's history were reviewed and updated as appropriate: allergies, current medications, past family history, past medical history, past social history, past surgical history and problem list. Problem list updated.  Objective:   Vitals:   04/02/16 0858  BP: 111/71  Pulse: 92  Weight: 209 lb (94.8 kg)    Fetal Status: Fetal Heart Rate (bpm): 148 Fundal Height: 32 cm Movement: Present     General:  Alert, oriented and cooperative. Patient is in no acute distress.  Skin: Skin is warm and dry. No rash noted.   Cardiovascular: Normal heart rate noted  Respiratory: Normal respiratory effort, no problems with respiration noted  Abdomen: Soft, gravid, appropriate for gestational age. Pain/Pressure: Present     Pelvic:  Cervical exam deferred        Extremities: Normal range of motion.  Edema: None  Mental Status: Normal mood and affect. Normal behavior. Normal judgment and thought content.   Assessment and Plan:  Pregnancy: G4P1021 at 2235w3d  1. Encounter for supervision of other normal pregnancy in first trimester Patient is doing well without complaints  2. Gestational diabetes mellitus (GDM) affecting pregnancy,  antepartum Diet controlled CBG's reviewed and all within range with the exception of 1 fasting value of 111 Encouraged her to continue her efforts  3. Obesity in pregnancy  4. Gestational diabetes mellitus (GDM) affecting pregnancy   Preterm labor symptoms and general obstetric precautions including but not limited to vaginal bleeding, contractions, leaking of fluid and fetal movement were reviewed in detail with the patient. Please refer to After Visit Summary for other counseling recommendations.  Return in about 2 weeks (around 04/16/2016).   Catalina AntiguaPeggy Bowie Doiron, MD

## 2016-04-06 ENCOUNTER — Encounter: Payer: Medicaid Other | Admitting: Obstetrics & Gynecology

## 2016-04-13 ENCOUNTER — Telehealth: Payer: Self-pay | Admitting: *Deleted

## 2016-04-13 DIAGNOSIS — O24419 Gestational diabetes mellitus in pregnancy, unspecified control: Secondary | ICD-10-CM

## 2016-04-13 MED ORDER — GLUCOSE BLOOD VI STRP: ORAL_STRIP | 12 refills | Status: DC

## 2016-04-13 MED ORDER — ACCU-CHEK FASTCLIX LANCETS MISC: 1.0000 [IU] | Freq: Four times a day (QID) | 12 refills | Status: DC

## 2016-04-13 NOTE — Telephone Encounter (Signed)
LM for pt to rtn call if needed. Sent strips/lancets to pharmacy

## 2016-04-13 NOTE — Telephone Encounter (Signed)
-----   Message from Lindell SparHeather L Bacon, [place] sent at 04/12/2016  1:43 PM EST ----- Regarding: needs diabetic strips call into pharmacy Contact: [phone] Needs strips called into [facility], [facility] [address]. Tried to call to see if they has a refill and said it was to soon

## 2016-04-15 ENCOUNTER — Ambulatory Visit (INDEPENDENT_AMBULATORY_CARE_PROVIDER_SITE_OTHER): Payer: Medicaid Other | Admitting: Student

## 2016-04-15 VITALS — BP 130/76 | HR 87 | Wt 207.0 lb

## 2016-04-15 DIAGNOSIS — O24419 Gestational diabetes mellitus in pregnancy, unspecified control: Secondary | ICD-10-CM

## 2016-04-15 DIAGNOSIS — Z3481 Encounter for supervision of other normal pregnancy, first trimester: Secondary | ICD-10-CM

## 2016-04-15 NOTE — Progress Notes (Signed)
   PRENATAL VISIT NOTE  Subjective:  Jocelyn Potts is a 31 y.o. [phone] at 6733w2d being seen today for ongoing prenatal care.  She is currently monitored for the following issues for this high-risk pregnancy and has OVERWEIGHT; TOBACCO ABUSE; TENSION HEADACHE; CHRONIC TENSION HEADACHE; RHINITIS, ALLERGIC; Disorder of teeth and supporting structures; LOW BACK PAIN, MILD; Supervision of normal pregnancy in first trimester; Marijuana abuse; Obesity in pregnancy; Gestational diabetes mellitus (GDM) affecting pregnancy, antepartum; and Gestational diabetes mellitus (GDM) affecting pregnancy on her problem list.  Patient reports no complaints.  Contractions: Not present. Vag. Bleeding: None.  Movement: Present. Denies leaking of fluid.   The following portions of the patient's history were reviewed and updated as appropriate: allergies, current medications, past family history, past medical history, past social history, past surgical history and problem list. Problem list updated.  Objective:   Vitals:   04/15/16 1320  BP: 130/76  Pulse: 87  Weight: 207 lb (93.9 kg)    Fetal Status: Fetal Heart Rate (bpm): 154   Movement: Present     General:  Alert, oriented and cooperative. Patient is in no acute distress.  Skin: Skin is warm and dry. No rash noted.   Cardiovascular: Normal heart rate noted  Respiratory: Normal respiratory effort, no problems with respiration noted  Abdomen: Soft, gravid, appropriate for gestational age. Pain/Pressure: Present     Pelvic:  Cervical exam deferred        Extremities: Normal range of motion.  Edema: None  Mental Status: Normal mood and affect. Normal behavior. Normal judgment and thought content.   Assessment and Plan:  Pregnancy: G4P1021 at 6933w2d  1. Encounter for supervision of other normal pregnancy in first trimester Patient feeling baby move a lot.   2. Gestational diabetes mellitus (GDM) affecting pregnancy  From 04-03-2016 to 2-14:  Fasting: High  98;  all other below 95; none below 80  Breakfast: High 140;  all other below 120 Lunch:  High 129, 129; all other below 120 Dinner:  High 132, 123 all other below 120 (132 value on Valentines Day)  Patient very motivated to keep up with her blood sugar control; happy that she does not have to go on medication. Patient knows she will have an US at 38 weeks.   Preterm labor symptoms and general obstetric precautions including but not limited to vaginal bleeding, contractions, leaking of fluid and fetal movement were reviewed in detail with the patient. Please refer to After Visit Summary for other counseling recommendations.  Return in about 2 weeks (around 04/29/2016).   Marylene LandKathryn Lorraine Carmeron Heady, CNM

## 2016-04-15 NOTE — Patient Instructions (Signed)
Third Trimester of Pregnancy The third trimester is from week 29 through week 40 (months 7 through 9). The third trimester is a time when the unborn baby (fetus) is growing rapidly. At the end of the ninth month, the fetus is about 20 inches in length and weighs 6-10 pounds. Body changes during your third trimester Your body goes through many changes during pregnancy. The changes vary from woman to woman. During the third trimester:  Your weight will continue to increase. You can expect to gain 25-35 pounds (11-16 kg) by the end of the pregnancy.  You may begin to get stretch marks on your hips, abdomen, and breasts.  You may urinate more often because the fetus is moving lower into your pelvis and pressing on your bladder.  You may develop or continue to have heartburn. This is caused by increased hormones that slow down muscles in the digestive tract.  You may develop or continue to have constipation because increased hormones slow digestion and cause the muscles that push waste through your intestines to relax.  You may develop hemorrhoids. These are swollen veins (varicose veins) in the rectum that can itch or be painful.  You may develop swollen, bulging veins (varicose veins) in your legs.  You may have increased body aches in the pelvis, back, or thighs. This is due to weight gain and increased hormones that are relaxing your joints.  You may have changes in your hair. These can include thickening of your hair, rapid growth, and changes in texture. Some women also have hair loss during or after pregnancy, or hair that feels dry or thin. Your hair will most likely return to normal after your baby is born.  Your breasts will continue to grow and they will continue to become tender. A yellow fluid (colostrum) may leak from your breasts. This is the first milk you are producing for your baby.  Your belly button may stick out.  You may notice more swelling in your hands, face, or  ankles.  You may have increased tingling or numbness in your hands, arms, and legs. The skin on your belly may also feel numb.  You may feel short of breath because of your expanding uterus.  You may have more problems sleeping. This can be caused by the size of your belly, increased need to urinate, and an increase in your body's metabolism.  You may notice the fetus "dropping," or moving lower in your abdomen.  You may have increased vaginal discharge.  Your cervix becomes thin and soft (effaced) near your due date. What to expect at prenatal visits You will have prenatal exams every 2 weeks until week 36. Then you will have weekly prenatal exams. During a routine prenatal visit:  You will be weighed to make sure you and the fetus are growing normally.  Your blood pressure will be taken.  Your abdomen will be measured to track your baby's growth.  The fetal heartbeat will be listened to.  Any test results from the previous visit will be discussed.  You may have a cervical check near your due date to see if you have effaced. At around 36 weeks, your health care provider will check your cervix. At the same time, your health care provider will also perform a test on the secretions of the vaginal tissue. This test is to determine if a type of bacteria, Group B streptococcus, is present. Your health care provider will explain this further. Your health care provider may ask you:    What your birth plan is.  How you are feeling.  If you are feeling the baby move.  If you have had any abnormal symptoms, such as leaking fluid, bleeding, severe headaches, or abdominal cramping.  If you are using any tobacco products, including cigarettes, chewing tobacco, and electronic cigarettes.  If you have any questions. Other tests or screenings that may be performed during your third trimester include:  Blood tests that check for low iron levels (anemia).  Fetal testing to check the health,  activity level, and growth of the fetus. Testing is done if you have certain medical conditions or if there are problems during the pregnancy.  Nonstress test (NST). This test checks the health of your baby to make sure there are no signs of problems, such as the baby not getting enough oxygen. During this test, a belt is placed around your belly. The baby is made to move, and its heart rate is monitored during movement. What is false labor? False labor is a condition in which you feel small, irregular tightenings of the muscles in the womb (contractions) that eventually go away. These are called Braxton Hicks contractions. Contractions may last for hours, days, or even weeks before true labor sets in. If contractions come at regular intervals, become more frequent, increase in intensity, or become painful, you should see your health care provider. What are the signs of labor?  Abdominal cramps.  Regular contractions that start at 10 minutes apart and become stronger and more frequent with time.  Contractions that start on the top of the uterus and spread down to the lower abdomen and back.  Increased pelvic pressure and dull back pain.  A watery or bloody mucus discharge that comes from the vagina.  Leaking of amniotic fluid. This is also known as your "water breaking." It could be a slow trickle or a gush. Let your doctor know if it has a color or strange odor. If you have any of these signs, call your health care provider right away, even if it is before your due date. Follow these instructions at home: Eating and drinking  Continue to eat regular, healthy meals.  Do not eat:  Raw meat or meat spreads.  Unpasteurized milk or cheese.  Unpasteurized juice.  Store-made salad.  Refrigerated smoked seafood.  Hot dogs or deli meat, unless they are piping hot.  More than 6 ounces of albacore tuna a week.  Shark, swordfish, king mackerel, or tile fish.  Store-made salads.  Raw  sprouts, such as mung bean or alfalfa sprouts.  Take prenatal vitamins as told by your health care provider.  Take 1000 mg of calcium daily as told by your health care provider.  If you develop constipation:  Take over-the-counter or prescription medicines.  Drink enough fluid to keep your urine clear or pale yellow.  Eat foods that are high in fiber, such as fresh fruits and vegetables, whole grains, and beans.  Limit foods that are high in fat and processed sugars, such as fried and sweet foods. Activity  Exercise only as directed by your health care provider. Healthy pregnant women should aim for 2 hours and 30 minutes of moderate exercise per week. If you experience any pain or discomfort while exercising, stop.  Avoid heavy lifting.  Do not exercise in extreme heat or humidity, or at high altitudes.  Wear low-heel, comfortable shoes.  Practice good posture.  Do not travel far distances unless it is absolutely necessary and only with the approval   of your health care provider.  Wear your seat belt at all times while in a car, on a bus, or on a plane.  Take frequent breaks and rest with your legs elevated if you have leg cramps or low back pain.  Do not use hot tubs, steam rooms, or saunas.  You may continue to have sex unless your health care provider tells you otherwise. Lifestyle  Do not use any products that contain nicotine or tobacco, such as cigarettes and e-cigarettes. If you need help quitting, ask your health care provider.  Do not drink alcohol.  Do not use any medicinal herbs or unprescribed drugs. These chemicals affect the formation and growth of the baby.  If you develop varicose veins:  Wear support pantyhose or compression stockings as told by your healthcare provider.  Elevate your feet for 15 minutes, 3-4 times a day.  Wear a supportive maternity bra to help with breast tenderness. General instructions  Take over-the-counter and prescription  medicines only as told by your health care provider. There are medicines that are either safe or unsafe to take during pregnancy.  Take warm sitz baths to soothe any pain or discomfort caused by hemorrhoids. Use hemorrhoid cream or witch hazel if your health care provider approves.  Avoid cat litter boxes and soil used by cats. These carry germs that can cause birth defects in the baby. If you have a cat, ask someone to clean the litter box for you.  To prepare for the arrival of your baby:  Take prenatal classes to understand, practice, and ask questions about the labor and delivery.  Make a trial run to the hospital.  Visit the hospital and tour the maternity area.  Arrange for maternity or paternity leave through employers.  Arrange for family and friends to take care of pets while you are in the hospital.  Purchase a rear-facing car seat and make sure you know how to install it in your car.  Pack your hospital bag.  Prepare the baby's nursery. Make sure to remove all pillows and stuffed animals from the baby's crib to prevent suffocation.  Visit your dentist if you have not gone during your pregnancy. Use a soft toothbrush to brush your teeth and be gentle when you floss.  Keep all prenatal follow-up visits as told by your health care provider. This is important. Contact a health care provider if:  You are unsure if you are in labor or if your water has broken.  You become dizzy.  You have mild pelvic cramps, pelvic pressure, or nagging pain in your abdominal area.  You have lower back pain.  You have persistent nausea, vomiting, or diarrhea.  You have an unusual or bad smelling vaginal discharge.  You have pain when you urinate. Get help right away if:  You have a fever.  You are leaking fluid from your vagina.  You have spotting or bleeding from your vagina.  You have severe abdominal pain or cramping.  You have rapid weight loss or weight gain.  You have  shortness of breath with chest pain.  You notice sudden or extreme swelling of your face, hands, ankles, feet, or legs.  Your baby makes fewer than 10 movements in 2 hours.  You have severe headaches that do not go away with medicine.  You have vision changes. Summary  The third trimester is from week 29 through week 40, months 7 through 9. The third trimester is a time when the unborn baby (fetus)   is growing rapidly.  During the third trimester, your discomfort may increase as you and your baby continue to gain weight. You may have abdominal, leg, and back pain, sleeping problems, and an increased need to urinate.  During the third trimester your breasts will keep growing and they will continue to become tender. A yellow fluid (colostrum) may leak from your breasts. This is the first milk you are producing for your baby.  False labor is a condition in which you feel small, irregular tightenings of the muscles in the womb (contractions) that eventually go away. These are called Braxton Hicks contractions. Contractions may last for hours, days, or even weeks before true labor sets in.  Signs of labor can include: abdominal cramps; regular contractions that start at 10 minutes apart and become stronger and more frequent with time; watery or bloody mucus discharge that comes from the vagina; increased pelvic pressure and dull back pain; and leaking of amniotic fluid. This information is not intended to replace advice given to you by your health care provider. Make sure you discuss any questions you have with your health care provider. Document Released: 02/09/2001 Document Revised: 07/24/2015 Document Reviewed: 04/18/2012 Elsevier Interactive Patient Education  2017 [employer]  

## 2016-04-15 NOTE — Progress Notes (Deleted)
   PRENATAL VISIT NOTE  Subjective:  Jocelyn Potts is a 31 y.o. [phone] at 6866w2d being seen today for ongoing prenatal care.  She is currently monitored for the following issues for this high-risk pregnancy and has OVERWEIGHT; TOBACCO ABUSE; TENSION HEADACHE; CHRONIC TENSION HEADACHE; RHINITIS, ALLERGIC; Disorder of teeth and supporting structures; LOW BACK PAIN, MILD; Supervision of normal pregnancy in first trimester; Marijuana abuse; Obesity in pregnancy; Gestational diabetes mellitus (GDM) affecting pregnancy, antepartum; and Gestational diabetes mellitus (GDM) affecting pregnancy on her problem list.  Patient reports no complaints.  Contractions: Not present. Vag. Bleeding: None.  Movement: Present. Denies leaking of fluid.   The following portions of the patient's history were reviewed and updated as appropriate: allergies, current medications, past family history, past medical history, past social history, past surgical history and problem list. Problem list updated.  Objective:   Vitals:   04/15/16 1320  BP: 130/76  Pulse: 87  Weight: 207 lb (93.9 kg)    Fetal Status: Fetal Heart Rate (bpm): 154   Movement: Present     General:  Alert, oriented and cooperative. Patient is in no acute distress.  Skin: Skin is warm and dry. No rash noted.   Cardiovascular: Normal heart rate noted  Respiratory: Normal respiratory effort, no problems with respiration noted  Abdomen: Soft, gravid, appropriate for gestational age. Pain/Pressure: Present     Pelvic:  {Blank single:19197::"Cervical exam performed","Cervical exam deferred"}        Extremities: Normal range of motion.  Edema: None  Mental Status: Normal mood and affect. Normal behavior. Normal judgment and thought content.   Assessment and Plan:  Pregnancy: G4P1021 at 5966w2d  Fasting: Highest fasting 98  All other below 95  Highest breakfast: 140 all other below 120 Highest lunch: 129, 129 all other below 120 Highest dinner: 132,  123 all other below 120 (high value on Valentines Day)      There are no diagnoses linked to this encounter. {Blank single:19197::"Term","Preterm"} labor symptoms and general obstetric precautions including but not limited to vaginal bleeding, contractions, leaking of fluid and fetal movement were reviewed in detail with the patient. Please refer to After Visit Summary for other counseling recommendations.  Return in about 2 weeks (around 04/29/2016).   Marylene LandKathryn Lorraine Naquisha Whitehair, CNM

## 2016-04-16 ENCOUNTER — Encounter: Payer: Medicaid Other | Admitting: Obstetrics and Gynecology

## 2016-04-29 ENCOUNTER — Other Ambulatory Visit (HOSPITAL_COMMUNITY)
Admission: RE | Admit: 2016-04-29 | Discharge: 2016-04-29 | Disposition: A | Discharge status: Home / Self Care | Payer: Medicaid Other | Source: Ambulatory Visit | Attending: Advanced Practice Midwife | Admitting: Advanced Practice Midwife

## 2016-04-29 ENCOUNTER — Ambulatory Visit (INDEPENDENT_AMBULATORY_CARE_PROVIDER_SITE_OTHER): Payer: Medicaid Other | Admitting: Advanced Practice Midwife

## 2016-04-29 VITALS — BP 122/75 | HR 92 | Wt 211.0 lb

## 2016-04-29 DIAGNOSIS — O24410 Gestational diabetes mellitus in pregnancy, diet controlled: Secondary | ICD-10-CM

## 2016-04-29 DIAGNOSIS — Z113 Encounter for screening for infections with a predominantly sexual mode of transmission: Secondary | ICD-10-CM | POA: Diagnosis not present

## 2016-04-29 DIAGNOSIS — O0993 Supervision of high risk pregnancy, unspecified, third trimester: Secondary | ICD-10-CM

## 2016-04-29 NOTE — Patient Instructions (Addendum)

## 2016-04-29 NOTE — Progress Notes (Signed)
   PRENATAL VISIT NOTE  Subjective:  Jocelyn Potts is a 31 y.o. [phone] at 3070w2d being seen today for ongoing prenatal care.  She is currently monitored for the following issues for this high-risk pregnancy and has OVERWEIGHT; TOBACCO ABUSE; TENSION HEADACHE; CHRONIC TENSION HEADACHE; RHINITIS, ALLERGIC; Disorder of teeth and supporting structures; LOW BACK PAIN, MILD; Supervision of normal pregnancy in first trimester; Marijuana abuse; Obesity in pregnancy; Gestational diabetes mellitus (GDM) affecting pregnancy, antepartum; and Gestational diabetes mellitus (GDM) affecting pregnancy on her problem list.  Patient reports occasional contractions and pressure and pain in ears since getting over sinus infection. No fevers or drainage from ears.  Contractions: Not present. Vag. Bleeding: None.  Movement: Present. Denies leaking of fluid.   The following portions of the patient's history were reviewed and updated as appropriate: allergies, current medications, past family history, past medical history, past social history, past surgical history and problem list. Problem list updated.  Objective:   Vitals:   04/29/16 1358  BP: 122/75  Pulse: 92  Weight: 211 lb (95.7 kg)    Fetal Status: Fetal Heart Rate (bpm): 145 Fundal Height: 36 cm Movement: Present  Presentation: Vertex  General:  Alert, oriented and cooperative. Patient is in no acute distress.  Skin: Skin is warm and dry. No rash noted.   Cardiovascular: Normal heart rate noted  Respiratory: Normal respiratory effort, no problems with respiration noted  Abdomen: Soft, gravid, appropriate for gestational age. Pain/Pressure: Present     Pelvic:  Cervical exam performed Dilation: 1.5 Effacement (%): 0 Station: -3  Extremities: Normal range of motion.  Edema: Trace  Mental Status: Normal mood and affect. Normal behavior. Normal judgment and thought content.   Fasting CBGs: 89-100 (9/14 out of range) PCB:  90-125 (2/14 out of range) PCL:   86-137 (3-14 out of range) PCD: 104-145 (5/14 out of range)   Assessment and Plan:  Pregnancy: [id number] at 3070w2d  1. Supervision of high risk pregnancy in third trimester  - Culture, beta strep (group b only) - Cervicovaginal ancillary only - US MFM OB FOLLOW UP; Future  2. Diet controlled White classification A1 gestational diabetes mellitus (GDM)  - US MFM OB FOLLOW UP; Future  Term labor symptoms and general obstetric precautions including but not limited to vaginal bleeding, contractions, leaking of fluid and fetal movement were reviewed in detail with the patient. Please refer to After Visit Summary for other counseling recommendations.  Discussed trying to decrease fasting CBGs. Will CTO closely to determine if pt needs Glyburide QHS.  Return in about 1 week (around 05/06/2016) for ROB.   Dorathy KinsmanVirginia Merrill Villarruel, CNM

## 2016-04-30 LAB — CERVICOVAGINAL ANCILLARY ONLY
Chlamydia: NEGATIVE
Neisseria Gonorrhea: NEGATIVE

## 2016-05-02 ENCOUNTER — Encounter: Payer: Self-pay | Admitting: Advanced Practice Midwife

## 2016-05-02 DIAGNOSIS — O99820 Streptococcus B carrier state complicating pregnancy: Secondary | ICD-10-CM | POA: Insufficient documentation

## 2016-05-02 LAB — OB RESULTS CONSOLE GBS: GBS: POSITIVE

## 2016-05-02 LAB — CULTURE, BETA STREP (GROUP B ONLY): STREP GP B CULTURE: POSITIVE — AB

## 2016-05-06 ENCOUNTER — Ambulatory Visit (INDEPENDENT_AMBULATORY_CARE_PROVIDER_SITE_OTHER): Payer: Medicaid Other | Admitting: Obstetrics & Gynecology

## 2016-05-06 VITALS — BP 119/76 | HR 87 | Wt 206.0 lb

## 2016-05-06 DIAGNOSIS — Z3481 Encounter for supervision of other normal pregnancy, first trimester: Secondary | ICD-10-CM

## 2016-05-06 DIAGNOSIS — O24419 Gestational diabetes mellitus in pregnancy, unspecified control: Secondary | ICD-10-CM | POA: Diagnosis not present

## 2016-05-06 DIAGNOSIS — O99820 Streptococcus B carrier state complicating pregnancy: Secondary | ICD-10-CM

## 2016-05-06 NOTE — Progress Notes (Signed)
   PRENATAL VISIT NOTE  Subjective:  Jocelyn Potts is a 31 y.o. SW [id number] at 533w2d being seen today for ongoing prenatal care.  She is currently monitored for the following issues for this high-risk pregnancy and has OVERWEIGHT; TOBACCO ABUSE; CHRONIC TENSION HEADACHE; RHINITIS, ALLERGIC; Disorder of teeth and supporting structures; LOW BACK PAIN, MILD; Supervision of normal pregnancy in first trimester; Marijuana abuse; Obesity in pregnancy; Gestational diabetes mellitus (GDM) affecting pregnancy, antepartum; Gestational diabetes mellitus (GDM) affecting pregnancy; and Group B Streptococcus carrier, antepartum on her problem list.  Patient reports no complaints.  Contractions: Not present. Vag. Bleeding: None.  Movement: Present. Denies leaking of fluid.   The following portions of the patient's history were reviewed and updated as appropriate: allergies, current medications, past family history, past medical history, past social history, past surgical history and problem list. Problem list updated.  Objective:   Vitals:   05/06/16 1112  BP: 119/76  Pulse: 87  Weight: 206 lb (93.4 kg)    Fetal Status: Fetal Heart Rate (bpm): 145   Movement: Present     General:  Alert, oriented and cooperative. Patient is in no acute distress.  Skin: Skin is warm and dry. No rash noted.   Cardiovascular: Normal heart rate noted  Respiratory: Normal respiratory effort, no problems with respiration noted  Abdomen: Soft, gravid, appropriate for gestational age. Pain/Pressure: Present     Pelvic:  Cervical exam performed        Extremities: Normal range of motion.  Edema: Trace  Mental Status: Normal mood and affect. Normal behavior. Normal judgment and thought content.   Assessment and Plan:  Pregnancy: G4P1021 at 7533w2d  1. Gestational diabetes mellitus (GDM) affecting pregnancy -Excellent sugars recorded, diet- controlled  2. Encounter for supervision of other normal pregnancy in first  trimester - IOL at 40 weeks  3. Group B Streptococcus carrier, antepartum - treat in labor  Term labor symptoms and general obstetric precautions including but not limited to vaginal bleeding, contractions, leaking of fluid and fetal movement were reviewed in detail with the patient. Please refer to After Visit Summary for other counseling recommendations.  Return in about 1 week (around 05/13/2016).   Allie BossierMyra C Daltyn Degroat, MD

## 2016-05-07 ENCOUNTER — Encounter: Payer: Self-pay | Admitting: *Deleted

## 2016-05-07 ENCOUNTER — Telehealth: Payer: Self-pay | Admitting: *Deleted

## 2016-05-07 DIAGNOSIS — O24419 Gestational diabetes mellitus in pregnancy, unspecified control: Secondary | ICD-10-CM

## 2016-05-07 MED ORDER — GLUCOSE BLOOD VI STRP: ORAL_STRIP | 12 refills | Status: DC

## 2016-05-07 NOTE — Telephone Encounter (Signed)
-----   Message from Lindell SparHeather L Bacon, [place] sent at 05/05/2016  8:04 AM EST ----- Regarding: Strips for glucose meter Contact: [phone] please call strips into [facility] on [address]  thanks

## 2016-05-12 ENCOUNTER — Ambulatory Visit (INDEPENDENT_AMBULATORY_CARE_PROVIDER_SITE_OTHER): Payer: Medicaid Other | Admitting: Obstetrics and Gynecology

## 2016-05-12 VITALS — BP 130/77 | HR 99 | Wt 208.0 lb

## 2016-05-12 DIAGNOSIS — O99213 Obesity complicating pregnancy, third trimester: Secondary | ICD-10-CM

## 2016-05-12 DIAGNOSIS — O24419 Gestational diabetes mellitus in pregnancy, unspecified control: Secondary | ICD-10-CM

## 2016-05-12 DIAGNOSIS — O24913 Unspecified diabetes mellitus in pregnancy, third trimester: Secondary | ICD-10-CM

## 2016-05-12 DIAGNOSIS — O99210 Obesity complicating pregnancy, unspecified trimester: Secondary | ICD-10-CM

## 2016-05-12 DIAGNOSIS — O99820 Streptococcus B carrier state complicating pregnancy: Secondary | ICD-10-CM

## 2016-05-12 DIAGNOSIS — Z3481 Encounter for supervision of other normal pregnancy, first trimester: Secondary | ICD-10-CM

## 2016-05-12 DIAGNOSIS — E669 Obesity, unspecified: Secondary | ICD-10-CM

## 2016-05-12 NOTE — Progress Notes (Signed)
Prenatal Visit Note Date: 05/12/2016 Clinic: [facility]  Subjective:  Jocelyn Potts is a 31 y.o. [id number] at 8464w1d being seen today for ongoing prenatal care.  She is currently monitored for the following issues for this high-risk pregnancy and has OVERWEIGHT; TOBACCO ABUSE; CHRONIC TENSION HEADACHE; RHINITIS, ALLERGIC; Disorder of teeth and supporting structures; LOW BACK PAIN, MILD; Supervision of normal pregnancy in first trimester; Marijuana abuse; Obesity in pregnancy; Gestational diabetes mellitus (GDM) affecting pregnancy, antepartum; Gestational diabetes mellitus (GDM) affecting pregnancy; and Group B Streptococcus carrier, antepartum on her problem list.  Patient reports no complaints.   Contractions: Not present. Vag. Bleeding: None.  Movement: Present. Denies leaking of fluid.   The following portions of the patient's history were reviewed and updated as appropriate: allergies, current medications, past family history, past medical history, past social history, past surgical history and problem list. Problem list updated.  Objective:   Vitals:   05/12/16 0946  BP: 130/77  Pulse: 99  Weight: 208 lb (94.3 kg)    Fetal Status: Fetal Heart Rate (bpm): 142 Fundal Height: 38 cm Movement: Present  Presentation: Vertex  General:  Alert, oriented and cooperative. Patient is in no acute distress.  Skin: Skin is warm and dry. No rash noted.   Cardiovascular: Normal heart rate noted  Respiratory: Normal respiratory effort, no problems with respiration noted  Abdomen: Soft, gravid, appropriate for gestational age. Pain/Pressure: Present     Pelvic:  Cervical exam performed Dilation: 1.5 Effacement (%): 50 Station: -3 (pt request)  Extremities: Normal range of motion.  Edema: None  Mental Status: Normal mood and affect. Normal behavior. Normal judgment and thought content.   Urinalysis:      Assessment and Plan:  Pregnancy: G4P1021 at 8164w1d  1. Encounter for  supervision of other normal pregnancy in first trimester Routine care. BTL. Papers UTD  2. Gestational diabetes mellitus (GDM) affecting pregnancy, antepartum Continue with a1 control. Forgot logbook but gives normal qid numbers. Set up IOL nv. Growth u/s tomorrow  3. Obesity in pregnancy Routine care  4. Group B Streptococcus carrier, antepartum tx in labor  Term labor symptoms and general obstetric precautions including but not limited to vaginal bleeding, contractions, leaking of fluid and fetal movement were reviewed in detail with the patient. Please refer to After Visit Summary for other counseling recommendations.  Return in about 5 days (around 05/17/2016) for 5-7d rob.   Rolling Fields Bingharlie Demian Maisel, MD

## 2016-05-13 ENCOUNTER — Other Ambulatory Visit: Payer: Self-pay | Admitting: Advanced Practice Midwife

## 2016-05-13 ENCOUNTER — Ambulatory Visit (HOSPITAL_COMMUNITY)
Admission: RE | Admit: 2016-05-13 | Discharge: 2016-05-13 | Disposition: A | Discharge status: Home / Self Care | Payer: Medicaid Other | Source: Ambulatory Visit | Attending: Advanced Practice Midwife | Admitting: Advanced Practice Midwife

## 2016-05-13 ENCOUNTER — Encounter (HOSPITAL_COMMUNITY): Payer: Self-pay | Admitting: *Deleted

## 2016-05-13 ENCOUNTER — Encounter: Payer: Medicaid Other | Admitting: Student

## 2016-05-13 ENCOUNTER — Inpatient Hospital Stay (HOSPITAL_COMMUNITY)
Admission: AD | Admit: 2016-05-13 | Discharge: 2016-05-14 | Disposition: A | Discharge status: Home / Self Care | Payer: Medicaid Other | Source: Ambulatory Visit | Attending: Obstetrics and Gynecology | Admitting: Obstetrics and Gynecology

## 2016-05-13 DIAGNOSIS — N949 Unspecified condition associated with female genital organs and menstrual cycle: Secondary | ICD-10-CM

## 2016-05-13 DIAGNOSIS — O99210 Obesity complicating pregnancy, unspecified trimester: Secondary | ICD-10-CM

## 2016-05-13 DIAGNOSIS — O99213 Obesity complicating pregnancy, third trimester: Secondary | ICD-10-CM | POA: Diagnosis present

## 2016-05-13 DIAGNOSIS — Z3A38 38 weeks gestation of pregnancy: Secondary | ICD-10-CM | POA: Insufficient documentation

## 2016-05-13 DIAGNOSIS — O24410 Gestational diabetes mellitus in pregnancy, diet controlled: Secondary | ICD-10-CM | POA: Insufficient documentation

## 2016-05-13 DIAGNOSIS — E669 Obesity, unspecified: Secondary | ICD-10-CM | POA: Insufficient documentation

## 2016-05-13 DIAGNOSIS — O0993 Supervision of high risk pregnancy, unspecified, third trimester: Secondary | ICD-10-CM

## 2016-05-13 DIAGNOSIS — O99820 Streptococcus B carrier state complicating pregnancy: Secondary | ICD-10-CM

## 2016-05-13 DIAGNOSIS — O24419 Gestational diabetes mellitus in pregnancy, unspecified control: Secondary | ICD-10-CM | POA: Insufficient documentation

## 2016-05-13 DIAGNOSIS — O99333 Smoking (tobacco) complicating pregnancy, third trimester: Secondary | ICD-10-CM | POA: Insufficient documentation

## 2016-05-13 DIAGNOSIS — R109 Unspecified abdominal pain: Secondary | ICD-10-CM | POA: Insufficient documentation

## 2016-05-13 DIAGNOSIS — O26893 Other specified pregnancy related conditions, third trimester: Secondary | ICD-10-CM | POA: Insufficient documentation

## 2016-05-13 NOTE — MAU Note (Signed)
PT  SAYS SHE  STARTED HAVING PAIN ON RIGHT  SIDE  AT  9PM  - SAME  NOW  AS WAS.     PNC-  STONEY  CREEK. YESTERDAY   1-2   CM .     DENIES HSV AND  MRSA.   GBS- POSITIVE

## 2016-05-14 DIAGNOSIS — O99213 Obesity complicating pregnancy, third trimester: Secondary | ICD-10-CM | POA: Diagnosis not present

## 2016-05-14 DIAGNOSIS — R109 Unspecified abdominal pain: Secondary | ICD-10-CM

## 2016-05-14 DIAGNOSIS — O26893 Other specified pregnancy related conditions, third trimester: Secondary | ICD-10-CM | POA: Diagnosis not present

## 2016-05-14 DIAGNOSIS — E669 Obesity, unspecified: Secondary | ICD-10-CM | POA: Diagnosis not present

## 2016-05-14 DIAGNOSIS — Z3A38 38 weeks gestation of pregnancy: Secondary | ICD-10-CM | POA: Diagnosis not present

## 2016-05-14 DIAGNOSIS — O99333 Smoking (tobacco) complicating pregnancy, third trimester: Secondary | ICD-10-CM | POA: Diagnosis not present

## 2016-05-14 DIAGNOSIS — O24419 Gestational diabetes mellitus in pregnancy, unspecified control: Secondary | ICD-10-CM | POA: Diagnosis not present

## 2016-05-14 NOTE — Discharge Instructions (Signed)

## 2016-05-14 NOTE — MAU Provider Note (Signed)
History     CSN: [id number]  Arrival date and time: 05/13/16 2320   First Provider Initiated Contact with Patient 05/14/16 0106      Chief Complaint  Patient presents with  . Abdominal Pain   Jocelyn Potts is a 31 y.o. [phone] at 67w3dwho presents today with pain on her right side since the evening. She rates her pain 5/10 at this time, and states that it has gotten better. She denies any VB or LOF. She confirms normal fetal movement. Next appointment: 05/17/16. She has GDM, diet controlled.    Abdominal Pain  This is a new problem. The current episode started today. The onset quality is gradual. The problem occurs constantly. The problem has been gradually improving. The pain is located in the right flank, RUQ and RLQ. The pain is at a severity of 5/10. The quality of the pain is cramping. The abdominal pain does not radiate. Pertinent negatives include no dysuria, fever, frequency, nausea or vomiting. Nothing aggravates the pain. The pain is relieved by nothing. She has tried nothing for the symptoms.    Past Medical History:  Diagnosis Date  . Diabetes mellitus without complication ([place]   . Medical history non-contributory     Past Surgical History:  Procedure Laterality Date  . DILATION AND CURETTAGE OF UTERUS     Therapeutic Abortion x2    Family History  Problem Relation Age of Onset  . Asthma Mother   . Hypertension Father   . Cancer Maternal Grandmother     lung  . Cancer Paternal Grandfather     Bone    Social History  Substance Use Topics  . Smoking status: Current Every Day Smoker    Packs/day: 0.25    Types: Cigarettes  . Smokeless tobacco: Never Used  . Alcohol use No    Allergies: No Known Allergies  Prescriptions Prior to Admission  Medication Sig Dispense Refill Last Dose  . ACCU-CHEK FASTCLIX LANCETS MISC 1 Units by Percutaneous route 4 (four) times daily. 100 each 12 Taking  . acetaminophen (TYLENOL) 325 MG tablet Take 650 mg by mouth every  6 (six) hours as needed.   Taking  . Blood Glucose Monitoring Suppl (ACCU-CHEK NANO SMARTVIEW) w/Device KIT 1 kit by Subdermal route as directed. Check blood sugars for fasting, and two hours after breakfast, lunch and dinner (4 checks daily) 1 kit 0 Taking  . docusate sodium (COLACE) 100 MG capsule Take 100 mg by mouth daily as needed for mild constipation.   Not Taking  . Elastic Bandages & Supports (FUTURO ABDOMINAL SUPPORT) MISC 1 Device by Does not apply route as needed. (Patient not taking: Reported on 05/12/2016) 1 each 0 Not Taking  . glucose blood (ACCU-CHEK SMARTVIEW) test strip Use as instructed to check blood sugars 100 each 12 Taking  . Prenatal Vit-Fe Fumarate-FA (PNV PRENATAL PLUS MULTIVITAMIN) 27-1 MG TABS Take 1 tablet by mouth daily.  11 Taking    Review of Systems  Constitutional: Negative for chills and fever.  Gastrointestinal: Positive for abdominal pain. Negative for nausea and vomiting.  Genitourinary: Negative for dysuria, frequency, urgency, vaginal bleeding and vaginal discharge.   Physical Exam   Blood pressure 139/87, pulse 74, temperature 98.1 F (36.7 C), temperature source Oral, resp. rate 20, last menstrual period 08/26/2015.  Physical Exam  Nursing note and vitals reviewed. Constitutional: She is oriented to person, place, and time. She appears well-developed and well-nourished. No distress.  HENT:  Head: Normocephalic.  Cardiovascular: Normal rate.  Respiratory: Effort normal.  GI: Soft. There is no tenderness. There is no rebound.  Neurological: She is alert and oriented to person, place, and time.  Skin: Skin is warm and dry.  Psychiatric: She has a normal mood and affect.   FHT: 125, moderate with 15x15 accels, no decels Toco: occasional UC  MAU Course  Procedures  MDM   Assessment and Plan   1. Abdominal pain in pregnancy, third trimester   2. Group B Streptococcus carrier, antepartum   3. Gestational diabetes mellitus (GDM) affecting  pregnancy   4. Gestational diabetes mellitus (GDM) affecting pregnancy, antepartum   5. Obesity in pregnancy   6. Round ligament pain    DC home Comfort measures reviewed  3rd Trimester precautions  labor precautions  Fetal kick counts RX: none  Return to MAU as needed FU with OB as planned  [place] [facility] [place] [facility] Follow up.   Specialty:  Obstetrics and Gynecology Contact information: [address] [place], [place] 05/14/2016, 1:07 AM

## 2016-05-17 ENCOUNTER — Ambulatory Visit (INDEPENDENT_AMBULATORY_CARE_PROVIDER_SITE_OTHER): Payer: Medicaid Other | Admitting: Family Medicine

## 2016-05-17 VITALS — BP 122/80 | HR 86 | Wt 206.0 lb

## 2016-05-17 DIAGNOSIS — O24913 Unspecified diabetes mellitus in pregnancy, third trimester: Secondary | ICD-10-CM

## 2016-05-17 DIAGNOSIS — O24419 Gestational diabetes mellitus in pregnancy, unspecified control: Secondary | ICD-10-CM

## 2016-05-17 DIAGNOSIS — O99820 Streptococcus B carrier state complicating pregnancy: Secondary | ICD-10-CM

## 2016-05-17 NOTE — Addendum Note (Signed)
Addended by: Reva BoresPRATT, TANYA S on: 05/17/2016 05:06 PM   Modules accepted: Orders, SmartSet

## 2016-05-17 NOTE — Patient Instructions (Signed)
 Third Trimester of Pregnancy The third trimester is from week 28 through week 40 (months 7 through 9). The third trimester is a time when the unborn baby (fetus) is growing rapidly. At the end of the ninth month, the fetus is about 20 inches in length and weighs 6-10 pounds. Body changes during your third trimester Your body will continue to go through many changes during pregnancy. The changes vary from woman to woman. During the third trimester:  Your weight will continue to increase. You can expect to gain 25-35 pounds (11-16 kg) by the end of the pregnancy.  You may begin to get stretch marks on your hips, abdomen, and breasts.  You may urinate more often because the fetus is moving lower into your pelvis and pressing on your bladder.  You may develop or continue to have heartburn. This is caused by increased hormones that slow down muscles in the digestive tract.  You may develop or continue to have constipation because increased hormones slow digestion and cause the muscles that push waste through your intestines to relax.  You may develop hemorrhoids. These are swollen veins (varicose veins) in the rectum that can itch or be painful.  You may develop swollen, bulging veins (varicose veins) in your legs.  You may have increased body aches in the pelvis, back, or thighs. This is due to weight gain and increased hormones that are relaxing your joints.  You may have changes in your hair. These can include thickening of your hair, rapid growth, and changes in texture. Some women also have hair loss during or after pregnancy, or hair that feels dry or thin. Your hair will most likely return to normal after your baby is born.  Your breasts will continue to grow and they will continue to become tender. A yellow fluid (colostrum) may leak from your breasts. This is the first milk you are producing for your baby.  Your belly button may stick out.  You may notice more swelling in your  hands, face, or ankles.  You may have increased tingling or numbness in your hands, arms, and legs. The skin on your belly may also feel numb.  You may feel short of breath because of your expanding uterus.  You may have more problems sleeping. This can be caused by the size of your belly, increased need to urinate, and an increase in your body's metabolism.  You may notice the fetus "dropping," or moving lower in your abdomen (lightening).  You may have increased vaginal discharge.  You may notice your joints feel loose and you may have pain around your pelvic bone.  What to expect at prenatal visits You will have prenatal exams every 2 weeks until week 36. Then you will have weekly prenatal exams. During a routine prenatal visit:  You will be weighed to make sure you and the baby are growing normally.  Your blood pressure will be taken.  Your abdomen will be measured to track your baby's growth.  The fetal heartbeat will be listened to.  Any test results from the previous visit will be discussed.  You may have a cervical check near your due date to see if your cervix has softened or thinned (effaced).  You will be tested for Group B streptococcus. This happens between 35 and 37 weeks.  Your health care provider may ask you:  What your birth plan is.  How you are feeling.  If you are feeling the baby move.  If you have   had any abnormal symptoms, such as leaking fluid, bleeding, severe headaches, or abdominal cramping.  If you are using any tobacco products, including cigarettes, chewing tobacco, and electronic cigarettes.  If you have any questions.  Other tests or screenings that may be performed during your third trimester include:  Blood tests that check for low iron levels (anemia).  Fetal testing to check the health, activity level, and growth of the fetus. Testing is done if you have certain medical conditions or if there are problems during the  pregnancy.  Nonstress test (NST). This test checks the health of your baby to make sure there are no signs of problems, such as the baby not getting enough oxygen. During this test, a belt is placed around your belly. The baby is made to move, and its heart rate is monitored during movement.  What is false labor? False labor is a condition in which you feel small, irregular tightenings of the muscles in the womb (contractions) that usually go away with rest, changing position, or drinking water. These are called Braxton Hicks contractions. Contractions may last for hours, days, or even weeks before true labor sets in. If contractions come at regular intervals, become more frequent, increase in intensity, or become painful, you should see your health care provider. What are the signs of labor?  Abdominal cramps.  Regular contractions that start at 10 minutes apart and become stronger and more frequent with time.  Contractions that start on the top of the uterus and spread down to the lower abdomen and back.  Increased pelvic pressure and dull back pain.  A watery or bloody mucus discharge that comes from the vagina.  Leaking of amniotic fluid. This is also known as your "water breaking." It could be a slow trickle or a gush. Let your health care provider know if it has a color or strange odor. If you have any of these signs, call your health care provider right away, even if it is before your due date. Follow these instructions at home: Medicines  Follow your health care provider's instructions regarding medicine use. Specific medicines may be either safe or unsafe to take during pregnancy.  Take a prenatal vitamin that contains at least 600 micrograms (mcg) of folic acid.  If you develop constipation, try taking a stool softener if your health care provider approves. Eating and drinking  Eat a balanced diet that includes fresh fruits and vegetables, whole grains, good sources of protein  such as meat, eggs, or tofu, and low-fat dairy. Your health care provider will help you determine the amount of weight gain that is right for you.  Avoid raw meat and uncooked cheese. These carry germs that can cause birth defects in the baby.  If you have low calcium intake from food, talk to your health care provider about whether you should take a daily calcium supplement.  Eat four or five small meals rather than three large meals a day.  Limit foods that are high in fat and processed sugars, such as fried and sweet foods.  To prevent constipation: ? Drink enough fluid to keep your urine clear or pale yellow. ? Eat foods that are high in fiber, such as fresh fruits and vegetables, whole grains, and beans. Activity  Exercise only as directed by your health care provider. Most women can continue their usual exercise routine during pregnancy. Try to exercise for 30 minutes at least 5 days a week. Stop exercising if you experience uterine contractions.  Avoid   heavy lifting.  Do not exercise in extreme heat or humidity, or at high altitudes.  Wear low-heel, comfortable shoes.  Practice good posture.  You may continue to have sex unless your health care provider tells you otherwise. Relieving pain and discomfort  Take frequent breaks and rest with your legs elevated if you have leg cramps or low back pain.  Take warm sitz baths to soothe any pain or discomfort caused by hemorrhoids. Use hemorrhoid cream if your health care provider approves.  Wear a good support bra to prevent discomfort from breast tenderness.  If you develop varicose veins: ? Wear support pantyhose or compression stockings as told by your healthcare provider. ? Elevate your feet for 15 minutes, 3-4 times a day. Prenatal care  Write down your questions. Take them to your prenatal visits.  Keep all your prenatal visits as told by your health care provider. This is important. Safety  Wear your seat belt at  all times when driving.  Make a list of emergency phone numbers, including numbers for family, friends, the hospital, and police and fire departments. General instructions  Avoid cat litter boxes and soil used by cats. These carry germs that can cause birth defects in the baby. If you have a cat, ask someone to clean the litter box for you.  Do not travel far distances unless it is absolutely necessary and only with the approval of your health care provider.  Do not use hot tubs, steam rooms, or saunas.  Do not drink alcohol.  Do not use any products that contain nicotine or tobacco, such as cigarettes and e-cigarettes. If you need help quitting, ask your health care provider.  Do not use any medicinal herbs or unprescribed drugs. These chemicals affect the formation and growth of the baby.  Do not douche or use tampons or scented sanitary pads.  Do not cross your legs for long periods of time.  To prepare for the arrival of your baby: ? Take prenatal classes to understand, practice, and ask questions about labor and delivery. ? Make a trial run to the hospital. ? Visit the hospital and tour the maternity area. ? Arrange for maternity or paternity leave through employers. ? Arrange for family and friends to take care of pets while you are in the hospital. ? Purchase a rear-facing car seat and make sure you know how to install it in your car. ? Pack your hospital bag. ? Prepare the baby's nursery. Make sure to remove all pillows and stuffed animals from the baby's crib to prevent suffocation.  Visit your dentist if you have not gone during your pregnancy. Use a soft toothbrush to brush your teeth and be gentle when you floss. Contact a health care provider if:  You are unsure if you are in labor or if your water has broken.  You become dizzy.  You have mild pelvic cramps, pelvic pressure, or nagging pain in your abdominal area.  You have lower back pain.  You have persistent  nausea, vomiting, or diarrhea.  You have an unusual or bad smelling vaginal discharge.  You have pain when you urinate. Get help right away if:  Your water breaks before 37 weeks.  You have regular contractions less than 5 minutes apart before 37 weeks.  You have a fever.  You are leaking fluid from your vagina.  You have spotting or bleeding from your vagina.  You have severe abdominal pain or cramping.  You have rapid weight loss or weight   gain.  You have shortness of breath with chest pain.  You notice sudden or extreme swelling of your face, hands, ankles, feet, or legs.  Your baby makes fewer than 10 movements in 2 hours.  You have severe headaches that do not go away when you take medicine.  You have vision changes. Summary  The third trimester is from week 28 through week 40, months 7 through 9. The third trimester is a time when the unborn baby (fetus) is growing rapidly.  During the third trimester, your discomfort may increase as you and your baby continue to gain weight. You may have abdominal, leg, and back pain, sleeping problems, and an increased need to urinate.  During the third trimester your breasts will keep growing and they will continue to become tender. A yellow fluid (colostrum) may leak from your breasts. This is the first milk you are producing for your baby.  False labor is a condition in which you feel small, irregular tightenings of the muscles in the womb (contractions) that eventually go away. These are called Braxton Hicks contractions. Contractions may last for hours, days, or even weeks before true labor sets in.  Signs of labor can include: abdominal cramps; regular contractions that start at 10 minutes apart and become stronger and more frequent with time; watery or bloody mucus discharge that comes from the vagina; increased pelvic pressure and dull back pain; and leaking of amniotic fluid. This information is not intended to replace advice  given to you by your health care provider. Make sure you discuss any questions you have with your health care provider. Document Released: 02/09/2001 Document Revised: 07/24/2015 Document Reviewed: 04/18/2012 [employer] Interactive Patient Education  2017 [employer]   Breastfeeding Deciding to breastfeed is one of the best choices you can make for you and your baby. A change in hormones during pregnancy causes your breast tissue to grow and increases the number and size of your milk ducts. These hormones also allow proteins, sugars, and fats from your blood supply to make breast milk in your milk-producing glands. Hormones prevent breast milk from being released before your baby is born as well as prompt milk flow after birth. Once breastfeeding has begun, thoughts of your baby, as well as his or her sucking or crying, can stimulate the release of milk from your milk-producing glands. Benefits of breastfeeding For Your Baby  Your first milk (colostrum) helps your baby's digestive system function better.  There are antibodies in your milk that help your baby fight off infections.  Your baby has a lower incidence of asthma, allergies, and sudden infant death syndrome.  The nutrients in breast milk are better for your baby than infant formulas and are designed uniquely for your baby's needs.  Breast milk improves your baby's brain development.  Your baby is less likely to develop other conditions, such as childhood obesity, asthma, or type 2 diabetes mellitus.  For You  Breastfeeding helps to create a very special bond between you and your baby.  Breastfeeding is convenient. Breast milk is always available at the correct temperature and costs nothing.  Breastfeeding helps to burn calories and helps you lose the weight gained during pregnancy.  Breastfeeding makes your uterus contract to its prepregnancy size faster and slows bleeding (lochia) after you give birth.  Breastfeeding helps  to lower your risk of developing type 2 diabetes mellitus, osteoporosis, and breast or ovarian cancer later in life.  Signs that your baby is hungry Early Signs of Hunger    Increased alertness or activity.  Stretching.  Movement of the head from side to side.  Movement of the head and opening of the mouth when the corner of the mouth or cheek is stroked (rooting).  Increased sucking sounds, smacking lips, cooing, sighing, or squeaking.  Hand-to-mouth movements.  Increased sucking of fingers or hands.  Late Signs of Hunger  Fussing.  Intermittent crying.  Extreme Signs of Hunger Signs of extreme hunger will require calming and consoling before your baby will be able to breastfeed successfully. Do not wait for the following signs of extreme hunger to occur before you initiate breastfeeding:  Restlessness.  A loud, strong cry.  Screaming.  Breastfeeding basics Breastfeeding Initiation  Find a comfortable place to sit or lie down, with your neck and back well supported.  Place a pillow or rolled up blanket under your baby to bring him or her to the level of your breast (if you are seated). Nursing pillows are specially designed to help support your arms and your baby while you breastfeed.  Make sure that your baby's abdomen is facing your abdomen.  Gently massage your breast. With your fingertips, massage from your chest wall toward your nipple in a circular motion. This encourages milk flow. You may need to continue this action during the feeding if your milk flows slowly.  Support your breast with 4 fingers underneath and your thumb above your nipple. Make sure your fingers are well away from your nipple and your baby's mouth.  Stroke your baby's lips gently with your finger or nipple.  When your baby's mouth is open wide enough, quickly bring your baby to your breast, placing your entire nipple and as much of the colored area around your nipple (areola) as possible into  your baby's mouth. ? More areola should be visible above your baby's upper lip than below the lower lip. ? Your baby's tongue should be between his or her lower gum and your breast.  Ensure that your baby's mouth is correctly positioned around your nipple (latched). Your baby's lips should create a seal on your breast and be turned out (everted).  It is common for your baby to suck about 2-3 minutes in order to start the flow of breast milk.  Latching Teaching your baby how to latch on to your breast properly is very important. An improper latch can cause nipple pain and decreased milk supply for you and poor weight gain in your baby. Also, if your baby is not latched onto your nipple properly, he or she may swallow some air during feeding. This can make your baby fussy. Burping your baby when you switch breasts during the feeding can help to get rid of the air. However, teaching your baby to latch on properly is still the best way to prevent fussiness from swallowing air while breastfeeding. Signs that your baby has successfully latched on to your nipple:  Silent tugging or silent sucking, without causing you pain.  Swallowing heard between every 3-4 sucks.  Muscle movement above and in front of his or her ears while sucking.  Signs that your baby has not successfully latched on to nipple:  Sucking sounds or smacking sounds from your baby while breastfeeding.  Nipple pain.  If you think your baby has not latched on correctly, slip your finger into the corner of your baby's mouth to break the suction and place it between your baby's gums. Attempt breastfeeding initiation again. Signs of Successful Breastfeeding Signs from your baby:  A   gradual decrease in the number of sucks or complete cessation of sucking.  Falling asleep.  Relaxation of his or her body.  Retention of a small amount of milk in his or her mouth.  Letting go of your breast by himself or herself.  Signs from  you:  Breasts that have increased in firmness, weight, and size 1-3 hours after feeding.  Breasts that are softer immediately after breastfeeding.  Increased milk volume, as well as a change in milk consistency and color by the fifth day of breastfeeding.  Nipples that are not sore, cracked, or bleeding.  Signs That Your Baby is Getting Enough Milk  Wetting at least 1-2 diapers during the first 24 hours after birth.  Wetting at least 5-6 diapers every 24 hours for the first week after birth. The urine should be clear or pale yellow by 5 days after birth.  Wetting 6-8 diapers every 24 hours as your baby continues to grow and develop.  At least 3 stools in a 24-hour period by age 5 days. The stool should be soft and yellow.  At least 3 stools in a 24-hour period by age 7 days. The stool should be seedy and yellow.  No loss of weight greater than 10% of birth weight during the first 3 days of age.  Average weight gain of 4-7 ounces (113-198 g) per week after age 4 days.  Consistent daily weight gain by age 5 days, without weight loss after the age of 2 weeks.  After a feeding, your baby may spit up a small amount. This is common. Breastfeeding frequency and duration Frequent feeding will help you make more milk and can prevent sore nipples and breast engorgement. Breastfeed when you feel the need to reduce the fullness of your breasts or when your baby shows signs of hunger. This is called "breastfeeding on demand." Avoid introducing a pacifier to your baby while you are working to establish breastfeeding (the first 4-6 weeks after your baby is born). After this time you may choose to use a pacifier. Research has shown that pacifier use during the first year of a baby's life decreases the risk of sudden infant death syndrome (SIDS). Allow your baby to feed on each breast as long as he or she wants. Breastfeed until your baby is finished feeding. When your baby unlatches or falls asleep  while feeding from the first breast, offer the second breast. Because newborns are often sleepy in the first few weeks of life, you may need to awaken your baby to get him or her to feed. Breastfeeding times will vary from baby to baby. However, the following rules can serve as a guide to help you ensure that your baby is properly fed:  Newborns (babies 4 weeks of age or younger) may breastfeed every 1-3 hours.  Newborns should not go longer than 3 hours during the day or 5 hours during the night without breastfeeding.  You should breastfeed your baby a minimum of 8 times in a 24-hour period until you begin to introduce solid foods to your baby at around 6 months of age.  Breast milk pumping Pumping and storing breast milk allows you to ensure that your baby is exclusively fed your breast milk, even at times when you are unable to breastfeed. This is especially important if you are going back to work while you are still breastfeeding or when you are not able to be present during feedings. Your lactation consultant can give you guidelines on how   long it is safe to store breast milk. A breast pump is a machine that allows you to pump milk from your breast into a sterile bottle. The pumped breast milk can then be stored in a refrigerator or freezer. Some breast pumps are operated by hand, while others use electricity. Ask your lactation consultant which type will work best for you. Breast pumps can be purchased, but some hospitals and breastfeeding support groups lease breast pumps on a monthly basis. A lactation consultant can teach you how to hand express breast milk, if you prefer not to use a pump. Caring for your breasts while you breastfeed Nipples can become dry, cracked, and sore while breastfeeding. The following recommendations can help keep your breasts moisturized and healthy:  Avoid using soap on your nipples.  Wear a supportive bra. Although not required, special nursing bras and tank  tops are designed to allow access to your breasts for breastfeeding without taking off your entire bra or top. Avoid wearing underwire-style bras or extremely tight bras.  Air dry your nipples for 3-4minutes after each feeding.  Use only cotton bra pads to absorb leaked breast milk. Leaking of breast milk between feedings is normal.  Use lanolin on your nipples after breastfeeding. Lanolin helps to maintain your skin's normal moisture barrier. If you use pure lanolin, you do not need to wash it off before feeding your baby again. Pure lanolin is not toxic to your baby. You may also hand express a few drops of breast milk and gently massage that milk into your nipples and allow the milk to air dry.  In the first few weeks after giving birth, some women experience extremely full breasts (engorgement). Engorgement can make your breasts feel heavy, warm, and tender to the touch. Engorgement peaks within 3-5 days after you give birth. The following recommendations can help ease engorgement:  Completely empty your breasts while breastfeeding or pumping. You may want to start by applying warm, moist heat (in the shower or with warm water-soaked hand towels) just before feeding or pumping. This increases circulation and helps the milk flow. If your baby does not completely empty your breasts while breastfeeding, pump any extra milk after he or she is finished.  Wear a snug bra (nursing or regular) or tank top for 1-2 days to signal your body to slightly decrease milk production.  Apply ice packs to your breasts, unless this is too uncomfortable for you.  Make sure that your baby is latched on and positioned properly while breastfeeding.  If engorgement persists after 48 hours of following these recommendations, contact your health care provider or a lactation consultant. Overall health care recommendations while breastfeeding  Eat healthy foods. Alternate between meals and snacks, eating 3 of each per  day. Because what you eat affects your breast milk, some of the foods may make your baby more irritable than usual. Avoid eating these foods if you are sure that they are negatively affecting your baby.  Drink milk, fruit juice, and water to satisfy your thirst (about 10 glasses a day).  Rest often, relax, and continue to take your prenatal vitamins to prevent fatigue, stress, and anemia.  Continue breast self-awareness checks.  Avoid chewing and smoking tobacco. Chemicals from cigarettes that pass into breast milk and exposure to secondhand smoke may harm your baby.  Avoid alcohol and drug use, including marijuana. Some medicines that may be harmful to your baby can pass through breast milk. It is important to ask your health care   provider before taking any medicine, including all over-the-counter and prescription medicine as well as vitamin and herbal supplements. It is possible to become pregnant while breastfeeding. If birth control is desired, ask your health care provider about options that will be safe for your baby. Contact a health care provider if:  You feel like you want to stop breastfeeding or have become frustrated with breastfeeding.  You have painful breasts or nipples.  Your nipples are cracked or bleeding.  Your breasts are red, tender, or warm.  You have a swollen area on either breast.  You have a fever or chills.  You have nausea or vomiting.  You have drainage other than breast milk from your nipples.  Your breasts do not become full before feedings by the fifth day after you give birth.  You feel sad and depressed.  Your baby is too sleepy to eat well.  Your baby is having trouble sleeping.  Your baby is wetting less than 3 diapers in a 24-hour period.  Your baby has less than 3 stools in a 24-hour period.  Your baby's skin or the white part of his or her eyes becomes yellow.  Your baby is not gaining weight by 5 days of age. Get help right away  if:  Your baby is overly tired (lethargic) and does not want to wake up and feed.  Your baby develops an unexplained fever. This information is not intended to replace advice given to you by your health care provider. Make sure you discuss any questions you have with your health care provider. Document Released: 02/15/2005 Document Revised: 07/30/2015 Document Reviewed: 08/09/2012 Elsevier Interactive Patient Education  2017 Elsevier Inc.  

## 2016-05-17 NOTE — Progress Notes (Signed)
    PRENATAL VISIT NOTE  Subjective:  Jocelyn Potts is a 31 y.o. [phone] at 2336w6d being seen today for ongoing prenatal care.  She is currently monitored for the following issues for this high-risk pregnancy and has OVERWEIGHT; TOBACCO ABUSE; CHRONIC TENSION HEADACHE; RHINITIS, ALLERGIC; Disorder of teeth and supporting structures; LOW BACK PAIN, MILD; Supervision of normal pregnancy in first trimester; Marijuana abuse; Obesity in pregnancy; Gestational diabetes mellitus (GDM) affecting pregnancy, antepartum; Gestational diabetes mellitus (GDM) affecting pregnancy; and Group B Streptococcus carrier, antepartum on her problem list.  Patient reports no complaints.  Contractions: Not present. Vag. Bleeding: None.  Movement: Present. Denies leaking of fluid.   The following portions of the patient's history were reviewed and updated as appropriate: allergies, current medications, past family history, past medical history, past social history, past surgical history and problem list. Problem list updated.  Objective:   Vitals:   05/17/16 1336  BP: 122/80  Pulse: 86  Weight: 206 lb (93.4 kg)    Fetal Status: Fetal Heart Rate (bpm): 141 Fundal Height: 37 cm Movement: Present  Presentation: Vertex  General:  Alert, oriented and cooperative. Patient is in no acute distress.  Skin: Skin is warm and dry. No rash noted.   Cardiovascular: Normal heart rate noted  Respiratory: Normal respiratory effort, no problems with respiration noted  Abdomen: Soft, gravid, appropriate for gestational age. Pain/Pressure: Present     Pelvic:  Cervical exam deferred Dilation: 2 Effacement (%): 80 Station: -3  Extremities: Normal range of motion.  Edema: Trace  Mental Status: Normal mood and affect. Normal behavior. Normal judgment and thought content.  FBS 80-97 (1 of 7 out of range 2 hr pp 79-124 (2 of 21 out of range) U/S growth 3/15 vtx, AFi  14.25, EFW 8 lb 1 oz (3654gm) >90% AC > 97% Assessment and Plan:    Pregnancy: G4P1021 at 2236w6d  1. Gestational diabetes mellitus (GDM) affecting pregnancy, antepartum Excellent glycemic control  2. Group B Streptococcus carrier, antepartum Needs treatment in labor  Preterm labor symptoms and general obstetric precautions including but not limited to vaginal bleeding, contractions, leaking of fluid and fetal movement were reviewed in detail with the patient. Please refer to After Visit Summary for other counseling recommendations.  Return in 1 week (on 05/24/2016).   Reva Boresanya S Natalin Bible, MD

## 2016-05-20 ENCOUNTER — Inpatient Hospital Stay (HOSPITAL_COMMUNITY): Payer: Medicaid Other | Admitting: Anesthesiology

## 2016-05-20 ENCOUNTER — Encounter (HOSPITAL_COMMUNITY)
Admission: RE | Disposition: A | Discharge status: Home / Self Care | Payer: Self-pay | Source: Ambulatory Visit | Attending: Obstetrics & Gynecology

## 2016-05-20 ENCOUNTER — Encounter (HOSPITAL_COMMUNITY): Payer: Self-pay

## 2016-05-20 ENCOUNTER — Inpatient Hospital Stay (HOSPITAL_COMMUNITY)
Admission: RE | Admit: 2016-05-20 | Discharge: 2016-05-22 | DRG: 767 | Disposition: A | Discharge status: Home / Self Care | Payer: Medicaid Other | Source: Ambulatory Visit | Attending: Obstetrics & Gynecology | Admitting: Obstetrics & Gynecology

## 2016-05-20 DIAGNOSIS — F17210 Nicotine dependence, cigarettes, uncomplicated: Secondary | ICD-10-CM | POA: Diagnosis present

## 2016-05-20 DIAGNOSIS — O99324 Drug use complicating childbirth: Secondary | ICD-10-CM | POA: Diagnosis present

## 2016-05-20 DIAGNOSIS — O24420 Gestational diabetes mellitus in childbirth, diet controlled: Secondary | ICD-10-CM | POA: Diagnosis present

## 2016-05-20 DIAGNOSIS — Z3A39 39 weeks gestation of pregnancy: Secondary | ICD-10-CM

## 2016-05-20 DIAGNOSIS — O99210 Obesity complicating pregnancy, unspecified trimester: Secondary | ICD-10-CM

## 2016-05-20 DIAGNOSIS — O99824 Streptococcus B carrier state complicating childbirth: Secondary | ICD-10-CM | POA: Diagnosis present

## 2016-05-20 DIAGNOSIS — F1290 Cannabis use, unspecified, uncomplicated: Secondary | ICD-10-CM | POA: Diagnosis present

## 2016-05-20 DIAGNOSIS — O24419 Gestational diabetes mellitus in pregnancy, unspecified control: Secondary | ICD-10-CM | POA: Diagnosis present

## 2016-05-20 DIAGNOSIS — Z302 Encounter for sterilization: Secondary | ICD-10-CM | POA: Diagnosis not present

## 2016-05-20 DIAGNOSIS — O99334 Smoking (tobacco) complicating childbirth: Secondary | ICD-10-CM | POA: Diagnosis present

## 2016-05-20 DIAGNOSIS — O99820 Streptococcus B carrier state complicating pregnancy: Secondary | ICD-10-CM

## 2016-05-20 DIAGNOSIS — Z8249 Family history of ischemic heart disease and other diseases of the circulatory system: Secondary | ICD-10-CM | POA: Diagnosis not present

## 2016-05-20 DIAGNOSIS — O24429 Gestational diabetes mellitus in childbirth, unspecified control: Secondary | ICD-10-CM

## 2016-05-20 DIAGNOSIS — O0993 Supervision of high risk pregnancy, unspecified, third trimester: Secondary | ICD-10-CM

## 2016-05-20 HISTORY — PX: TUBAL LIGATION: SHX77

## 2016-05-20 LAB — CBC
HEMATOCRIT: 30.9 % — AB (ref 36.0–46.0)
HEMOGLOBIN: 11 g/dL — AB (ref 12.0–15.0)
MCH: 32.1 pg (ref 26.0–34.0)
MCHC: 35.6 g/dL (ref 30.0–36.0)
MCV: 90.1 fL (ref 78.0–100.0)
Platelets: 261 10*3/uL (ref 150–400)
RBC: 3.43 MIL/uL — ABNORMAL LOW (ref 3.87–5.11)
RDW: 14.1 % (ref 11.5–15.5)
WBC: 12.3 10*3/uL — AB (ref 4.0–10.5)

## 2016-05-20 LAB — GLUCOSE, CAPILLARY
GLUCOSE-CAPILLARY: 89 mg/dL (ref 65–99)
Glucose-Capillary: 87 mg/dL (ref 65–99)

## 2016-05-20 LAB — ABO/RH: ABO/RH(D): A POS

## 2016-05-20 LAB — TYPE AND SCREEN
ABO/RH(D): A POS
Antibody Screen: NEGATIVE

## 2016-05-20 SURGERY — LIGATION, FALLOPIAN TUBE, POSTPARTUM
Anesthesia: Epidural

## 2016-05-20 MED ORDER — FENTANYL CITRATE (PF) 100 MCG/2ML IJ SOLN
INTRAMUSCULAR | Status: DC | PRN
  Administered 2016-05-20: 100 ug via EPIDURAL

## 2016-05-20 MED ORDER — OXYTOCIN BOLUS FROM INFUSION: 500.0000 mL | Freq: Once | INTRAVENOUS | Status: DC

## 2016-05-20 MED ORDER — OXYCODONE-ACETAMINOPHEN 5-325 MG PO TABS: 2.0000 | ORAL_TABLET | ORAL | Status: DC | PRN

## 2016-05-20 MED ORDER — FENTANYL 2.5 MCG/ML BUPIVACAINE 1/10 % EPIDURAL INFUSION (WH - ANES)
14.0000 mL/h | INTRAMUSCULAR | Status: DC | PRN
  Administered 2016-05-20: 14 mL/h via EPIDURAL
  Filled 2016-05-20: qty 100

## 2016-05-20 MED ORDER — PENICILLIN G POT IN DEXTROSE 60000 UNIT/ML IV SOLN
3.0000 10*6.[IU] | INTRAVENOUS | Status: DC
  Administered 2016-05-20: 3 10*6.[IU] via INTRAVENOUS
  Filled 2016-05-20 (×4): qty 50

## 2016-05-20 MED ORDER — LIDOCAINE-EPINEPHRINE (PF) 2 %-1:200000 IJ SOLN
INTRAMUSCULAR | Status: AC
  Filled 2016-05-20: qty 20

## 2016-05-20 MED ORDER — OXYTOCIN 40 UNITS IN LACTATED RINGERS INFUSION - SIMPLE MED
1.0000 m[IU]/min | INTRAVENOUS | Status: DC
  Administered 2016-05-20: 2 m[IU]/min via INTRAVENOUS
  Filled 2016-05-20: qty 1000

## 2016-05-20 MED ORDER — DIPHENHYDRAMINE HCL 25 MG PO CAPS: 25.0000 mg | ORAL_CAPSULE | Freq: Four times a day (QID) | ORAL | Status: DC | PRN

## 2016-05-20 MED ORDER — WITCH HAZEL-GLYCERIN EX PADS: 1.0000 "application " | MEDICATED_PAD | CUTANEOUS | Status: DC | PRN

## 2016-05-20 MED ORDER — MIDAZOLAM HCL 2 MG/2ML IJ SOLN
INTRAMUSCULAR | Status: AC
  Filled 2016-05-20: qty 2

## 2016-05-20 MED ORDER — SENNOSIDES-DOCUSATE SODIUM 8.6-50 MG PO TABS
2.0000 | ORAL_TABLET | ORAL | Status: DC
  Administered 2016-05-20 – 2016-05-22 (×2): 2 via ORAL
  Filled 2016-05-20 (×2): qty 2

## 2016-05-20 MED ORDER — LACTATED RINGERS IV SOLN
INTRAVENOUS | Status: DC | PRN
  Administered 2016-05-20 (×2): via INTRAVENOUS

## 2016-05-20 MED ORDER — EPHEDRINE 5 MG/ML INJ: 10.0000 mg | INTRAVENOUS | Status: DC | PRN

## 2016-05-20 MED ORDER — ONDANSETRON HCL 4 MG/2ML IJ SOLN: 4.0000 mg | INTRAMUSCULAR | Status: DC | PRN

## 2016-05-20 MED ORDER — DIPHENHYDRAMINE HCL 50 MG/ML IJ SOLN: 12.5000 mg | INTRAMUSCULAR | Status: DC | PRN

## 2016-05-20 MED ORDER — PHENYLEPHRINE 40 MCG/ML (10ML) SYRINGE FOR IV PUSH (FOR BLOOD PRESSURE SUPPORT)
PREFILLED_SYRINGE | INTRAVENOUS | Status: AC
  Filled 2016-05-20: qty 10

## 2016-05-20 MED ORDER — SOD CITRATE-CITRIC ACID 500-334 MG/5ML PO SOLN
30.0000 mL | ORAL | Status: DC | PRN
  Administered 2016-05-20: 30 mL via ORAL
  Filled 2016-05-20: qty 15

## 2016-05-20 MED ORDER — LIDOCAINE HCL (PF) 1 % IJ SOLN
INTRAMUSCULAR | Status: DC | PRN
  Administered 2016-05-20 (×2): 5 mL

## 2016-05-20 MED ORDER — TETANUS-DIPHTH-ACELL PERTUSSIS 5-2.5-18.5 LF-MCG/0.5 IM SUSP: 0.5000 mL | Freq: Once | INTRAMUSCULAR | Status: DC

## 2016-05-20 MED ORDER — FENTANYL CITRATE (PF) 100 MCG/2ML IJ SOLN: 25.0000 ug | INTRAMUSCULAR | Status: DC | PRN

## 2016-05-20 MED ORDER — BUPIVACAINE HCL (PF) 0.5 % IJ SOLN
INTRAMUSCULAR | Status: AC
  Filled 2016-05-20: qty 30

## 2016-05-20 MED ORDER — SODIUM BICARBONATE 8.4 % IV SOLN
INTRAVENOUS | Status: DC | PRN
  Administered 2016-05-20: 5 mL via EPIDURAL
  Administered 2016-05-20: 2 mL via EPIDURAL
  Administered 2016-05-20: 3 mL via EPIDURAL

## 2016-05-20 MED ORDER — LIDOCAINE HCL (PF) 1 % IJ SOLN
INTRAMUSCULAR | Status: DC | PRN
  Administered 2016-05-20: 4 mL via EPIDURAL

## 2016-05-20 MED ORDER — SODIUM CHLORIDE 0.9 % IR SOLN
Status: DC | PRN
  Administered 2016-05-20: 1

## 2016-05-20 MED ORDER — ONDANSETRON HCL 4 MG PO TABS: 4.0000 mg | ORAL_TABLET | ORAL | Status: DC | PRN

## 2016-05-20 MED ORDER — PHENYLEPHRINE 40 MCG/ML (10ML) SYRINGE FOR IV PUSH (FOR BLOOD PRESSURE SUPPORT): 80.0000 ug | PREFILLED_SYRINGE | INTRAVENOUS | Status: DC | PRN

## 2016-05-20 MED ORDER — LACTATED RINGERS IV SOLN: 500.0000 mL | Freq: Once | INTRAVENOUS | Status: DC

## 2016-05-20 MED ORDER — CALCIUM CARBONATE ANTACID 500 MG PO CHEW
1.0000 | CHEWABLE_TABLET | ORAL | Status: DC | PRN
  Administered 2016-05-20: 200 mg via ORAL
  Filled 2016-05-20: qty 1

## 2016-05-20 MED ORDER — FENTANYL CITRATE (PF) 100 MCG/2ML IJ SOLN
INTRAMUSCULAR | Status: AC
  Filled 2016-05-20: qty 2

## 2016-05-20 MED ORDER — PENICILLIN G POTASSIUM 5000000 UNITS IJ SOLR
5.0000 10*6.[IU] | Freq: Once | INTRAVENOUS | Status: AC
  Administered 2016-05-20: 5 10*6.[IU] via INTRAVENOUS
  Filled 2016-05-20: qty 5

## 2016-05-20 MED ORDER — KETOROLAC TROMETHAMINE 30 MG/ML IJ SOLN
INTRAMUSCULAR | Status: DC | PRN
  Administered 2016-05-20: 30 mg via INTRAVENOUS

## 2016-05-20 MED ORDER — ACETAMINOPHEN 325 MG PO TABS: 650.0000 mg | ORAL_TABLET | ORAL | Status: DC | PRN

## 2016-05-20 MED ORDER — ONDANSETRON HCL 4 MG/2ML IJ SOLN: 4.0000 mg | Freq: Four times a day (QID) | INTRAMUSCULAR | Status: DC | PRN

## 2016-05-20 MED ORDER — ACETAMINOPHEN 325 MG PO TABS
650.0000 mg | ORAL_TABLET | ORAL | Status: DC | PRN
  Administered 2016-05-20: 650 mg via ORAL
  Filled 2016-05-20: qty 2

## 2016-05-20 MED ORDER — LIDOCAINE HCL (PF) 1 % IJ SOLN
INTRAMUSCULAR | Status: AC
  Filled 2016-05-20: qty 10

## 2016-05-20 MED ORDER — LACTATED RINGERS IV SOLN
INTRAVENOUS | Status: DC
  Administered 2016-05-20: 08:00:00 via INTRAVENOUS

## 2016-05-20 MED ORDER — SIMETHICONE 80 MG PO CHEW
80.0000 mg | CHEWABLE_TABLET | ORAL | Status: DC | PRN
  Administered 2016-05-22: 80 mg via ORAL
  Filled 2016-05-20: qty 1

## 2016-05-20 MED ORDER — PRENATAL MULTIVITAMIN CH
1.0000 | ORAL_TABLET | Freq: Every day | ORAL | Status: DC
  Administered 2016-05-21: 1 via ORAL
  Filled 2016-05-20: qty 1

## 2016-05-20 MED ORDER — IBUPROFEN 600 MG PO TABS
600.0000 mg | ORAL_TABLET | Freq: Four times a day (QID) | ORAL | Status: DC
  Administered 2016-05-20 – 2016-05-22 (×7): 600 mg via ORAL
  Filled 2016-05-20 (×7): qty 1

## 2016-05-20 MED ORDER — LACTATED RINGERS IV SOLN
500.0000 mL | INTRAVENOUS | Status: DC | PRN
  Administered 2016-05-20: 500 mL via INTRAVENOUS

## 2016-05-20 MED ORDER — COCONUT OIL OIL: 1.0000 "application " | TOPICAL_OIL | Status: DC | PRN

## 2016-05-20 MED ORDER — ONDANSETRON HCL 4 MG/2ML IJ SOLN
INTRAMUSCULAR | Status: AC
  Filled 2016-05-20: qty 2

## 2016-05-20 MED ORDER — BENZOCAINE-MENTHOL 20-0.5 % EX AERO: 1.0000 "application " | INHALATION_SPRAY | CUTANEOUS | Status: DC | PRN

## 2016-05-20 MED ORDER — BUPIVACAINE HCL (PF) 0.5 % IJ SOLN
INTRAMUSCULAR | Status: DC | PRN
  Administered 2016-05-20: 20 mL

## 2016-05-20 MED ORDER — ZOLPIDEM TARTRATE 5 MG PO TABS: 5.0000 mg | ORAL_TABLET | Freq: Every evening | ORAL | Status: DC | PRN

## 2016-05-20 MED ORDER — LIDOCAINE HCL (PF) 1 % IJ SOLN
30.0000 mL | INTRAMUSCULAR | Status: DC | PRN
  Filled 2016-05-20: qty 30

## 2016-05-20 MED ORDER — FLEET ENEMA 7-19 GM/118ML RE ENEM: 1.0000 | ENEMA | RECTAL | Status: DC | PRN

## 2016-05-20 MED ORDER — OXYCODONE-ACETAMINOPHEN 5-325 MG PO TABS: 1.0000 | ORAL_TABLET | ORAL | Status: DC | PRN

## 2016-05-20 MED ORDER — OXYTOCIN 40 UNITS IN LACTATED RINGERS INFUSION - SIMPLE MED: 2.5000 [IU]/h | INTRAVENOUS | Status: DC

## 2016-05-20 MED ORDER — PHENYLEPHRINE 40 MCG/ML (10ML) SYRINGE FOR IV PUSH (FOR BLOOD PRESSURE SUPPORT)
80.0000 ug | PREFILLED_SYRINGE | INTRAVENOUS | Status: DC | PRN
  Filled 2016-05-20: qty 10

## 2016-05-20 MED ORDER — TERBUTALINE SULFATE 1 MG/ML IJ SOLN: 0.2500 mg | Freq: Once | INTRAMUSCULAR | Status: DC | PRN

## 2016-05-20 MED ORDER — DIBUCAINE 1 % RE OINT: 1.0000 "application " | TOPICAL_OINTMENT | RECTAL | Status: DC | PRN

## 2016-05-20 MED ORDER — ONDANSETRON HCL 4 MG/2ML IJ SOLN
INTRAMUSCULAR | Status: DC | PRN
  Administered 2016-05-20: 4 mg via INTRAVENOUS

## 2016-05-20 SURGICAL SUPPLY — 27 items
APL SKNCLS STERI-STRIP NONHPOA (GAUZE/BANDAGES/DRESSINGS)
BENZOIN TINCTURE PRP APPL 2/3 (GAUZE/BANDAGES/DRESSINGS) IMPLANT
BLADE SURG 11 STRL SS (BLADE) ×2 IMPLANT
CLIP FILSHIE TUBAL LIGA STRL (Clip) ×2 IMPLANT
CLOTH BEACON ORANGE TIMEOUT ST (SAFETY) ×2 IMPLANT
DRSG COVADERM PLUS 2X2 (GAUZE/BANDAGES/DRESSINGS) ×2 IMPLANT
DRSG OPSITE POSTOP 3X4 (GAUZE/BANDAGES/DRESSINGS) ×2 IMPLANT
DURAPREP 26ML APPLICATOR (WOUND CARE) ×2 IMPLANT
ELECT REM PT RETURN 9FT ADLT (ELECTROSURGICAL) ×2
ELECTRODE REM PT RTRN 9FT ADLT (ELECTROSURGICAL) ×1 IMPLANT
GLOVE BIOGEL PI IND STRL 7.0 (GLOVE) ×3 IMPLANT
GLOVE BIOGEL PI INDICATOR 7.0 (GLOVE) ×3
GLOVE ECLIPSE 7.0 STRL STRAW (GLOVE) ×2 IMPLANT
GOWN STRL REUS W/TWL LRG LVL3 (GOWN DISPOSABLE) ×4 IMPLANT
NEEDLE HYPO 22GX1.5 SAFETY (NEEDLE) ×4 IMPLANT
NS IRRIG 1000ML POUR BTL (IV SOLUTION) ×2 IMPLANT
PACK ABDOMINAL MINOR (CUSTOM PROCEDURE TRAY) ×2 IMPLANT
PENCIL BUTTON HOLSTER BLD 10FT (ELECTRODE) ×2 IMPLANT
PROTECTOR NERVE ULNAR (MISCELLANEOUS) ×2 IMPLANT
SPONGE LAP 4X18 X RAY DECT (DISPOSABLE) IMPLANT
SUT VIC AB 0 CT1 27 (SUTURE) ×2
SUT VIC AB 0 CT1 27XBRD ANBCTR (SUTURE) ×1 IMPLANT
SUT VICRYL 4-0 PS2 18IN ABS (SUTURE) ×2 IMPLANT
SYR CONTROL 10ML LL (SYRINGE) ×4 IMPLANT
TOWEL OR 17X24 6PK STRL BLUE (TOWEL DISPOSABLE) ×4 IMPLANT
TRAY FOLEY CATH SILVER 14FR (SET/KITS/TRAYS/PACK) ×2 IMPLANT
WATER STERILE IRR 1000ML POUR (IV SOLUTION) ×2 IMPLANT

## 2016-05-20 NOTE — Progress Notes (Signed)
BTL Consent  Patient desires permanent sterilization.  Other reversible forms of contraception were discussed with patient; she declines all other modalities. Risks of procedure discussed with patient including but not limited to: risk of regret, permanence of method, bleeding, infection, injury to surrounding organs and need for additional procedures.  Failure risk of 1-2% with increased risk of ectopic gestation if pregnancy occurs was also discussed with patient.  The patient concurred with the proposed plan, giving informed written consent for the procedures.  Patient has been NPO and she will remain NPO for procedure. Anesthesia and OR aware.  Preoperative prophylactic antibiotics and SCDs ordered on call to the OR.  To OR when ready.  Ernestina PennaNicholas Izayiah Tibbitts, MD 05/20/16 [phone]

## 2016-05-20 NOTE — Transfer of Care (Signed)
Immediate Anesthesia Transfer of Care Note  Patient: Jocelyn Potts  Procedure(s) Performed: Procedure(s): POST PARTUM TUBAL LIGATION (N/A)  Patient Location: PACU  Anesthesia Type:Epidural  Level of Consciousness: awake, alert  and oriented  Airway & Oxygen Therapy: Patient Spontanous Breathing  Post-op Assessment: Report given to RN and Post -op Vital signs reviewed and stable  Post vital signs: Reviewed and stable  Last Vitals:  Vitals:   05/20/16 1645 05/20/16 1803  BP: 123/65   Pulse: 66 66  Resp: 18   Temp:  36.4 C    Last Pain:  Vitals:   05/20/16 1349  TempSrc:   PainSc: 3          Complications: No apparent anesthesia complications

## 2016-05-20 NOTE — Op Note (Signed)
Jocelyn Potts J Burack 05/20/2016  PREOPERATIVE DIAGNOSES: Multiparity, undesired fertility  POSTOPERATIVE DIAGNOSES: Multiparity, undesired fertility  PROCEDURE:  Postpartum Bilateral Tubal Sterilization using Filshie Clips   SURGEON: Dr.  Jaynie CollinsUgonna Cresencia Asmus  ASSISTANTS: Dr. Ernestina PennaNicholas Schenk, Genice Rougeracey Tucker, RNFA  ANESTHESIA:  Epidural and local analgesia using 30 ml of 0.5% Marcaine  COMPLICATIONS:  None immediate.  ESTIMATED BLOOD LOSS: 5 ml.  FLUIDS: 1000 ml LR.  INDICATIONS:  31 y.o. [id number] with undesired fertility,status post vaginal delivery, desires permanent sterilization.  Other reversible forms of contraception were discussed with patient; she declines all other modalities. Risks of procedure discussed with patient including but not limited to: risk of regret, permanence of method, bleeding, infection, injury to surrounding organs and need for additional procedures.  Failure risk of 1 -2 % with increased risk of ectopic gestation if pregnancy occurs was also discussed with patient.      FINDINGS:  Normal uterus, tubes, and ovaries.  PROCEDURE DETAILS: The patient was taken to the operating room where her epidural anesthesia was dosed up to surgical level and found to be adequate.  She was then placed in the dorsal supine position and prepped and draped in sterile fashion.  After an adequate timeout was performed, attention was turned to the patient's abdomen where a small transverse skin incision was made under the umbilical fold. The incision was taken down to the layer of fascia using the scalpel, and fascia was incised, and extended bilaterally using Mayo scissors. The peritoneum was entered in a sharp fashion. Attention was then turned to the patient's uterus, and left fallopian tube was identified and followed out to the fimbriated end.  A Filshie clip was placed on the left fallopian tube about 3 cm from the cornual attachment, with care given to incorporate the underlying mesosalpinx.  A  similar process was carried out on the right side allowing for bilateral tubal sterilization.  Good hemostasis was noted overall.  Local analgesia was injected into both Filshie application sites.The instruments were then removed from the patient's abdomen and the fascial incision was repaired with 0 Vicryl, and the skin was closed with a 4-0 Vicryl subcuticular stitch. The patient tolerated the procedure well.  Instrument, sponge, and needle counts were correct times two.  The patient was then taken to the recovery room awake and in stable condition.   Jaynie CollinsUGONNA  Khalil Belote, MD, FACOG Attending Obstetrician & Gynecologist Faculty Practice, [facility] [place]

## 2016-05-20 NOTE — Progress Notes (Addendum)
Jocelyn Potts is a 31 y.o. G4P1021 at 824w2d   Subjective: Patient feeling more pressure. No new complaints.  Objective: BP 118/72   Pulse 73   Temp 98.1 F (36.7 C) (Oral)   Resp 16   Ht 5\' 7"  (1.702 m)   Wt 206 lb (93.4 kg)   LMP 08/26/2015 (Exact Date)   SpO2 100%   BMI 32.26 kg/m  No intake/output data recorded. No intake/output data recorded.  FHT:  FHR: 135 bpm, variability: moderate,  accelerations:  Present,  decelerations:  Absent UC:   regular, every 2-3 minutes SVE:   Dilation: 5 Effacement (%): 60 Station: -1 Exam by:: Dr Abelardo DieselMcMullen   Labs: Lab Results  Component Value Date   WBC 12.3 (H) 05/20/2016   HGB 11.0 (L) 05/20/2016   HCT 30.9 (L) 05/20/2016   MCV 90.1 05/20/2016   PLT 261 05/20/2016    Assessment / Plan: Spontaneous labor, progressing normally, clear AROM 1418.  Labor: Progressing normally Preeclampsia:  None Fetal Wellbeing:  Category I Pain Control:  Labor support without medications I/D:  GBS positive on PCN Anticipated MOD:  NSVD  [name], DO, PGY-1 05/20/2016, 2:21 PM

## 2016-05-20 NOTE — Anesthesia Preprocedure Evaluation (Addendum)
Anesthesia Evaluation  Patient identified by MRN, date of birth, ID band Patient awake    Reviewed: Allergy & Precautions, Patient's Chart, lab work & pertinent test results  Airway Mallampati: II  TM Distance: >3 FB Neck ROM: Full    Dental  (+) Teeth Intact, Dental Advisory Given   Pulmonary Current Smoker,    breath sounds clear to auscultation       Cardiovascular negative cardio ROS   Rhythm:Regular Rate:Normal     Neuro/Psych  Headaches, PSYCHIATRIC DISORDERS    GI/Hepatic negative GI ROS, Neg liver ROS,   Endo/Other  diabetes, Gestational  Renal/GU negative Renal ROS  negative genitourinary   Musculoskeletal negative musculoskeletal ROS (+)   Abdominal   Peds negative pediatric ROS (+)  Hematology negative hematology ROS (+)   Anesthesia Other Findings   Reproductive/Obstetrics (+) Pregnancy                            Lab Results  Component Value Date   WBC 12.3 (H) 05/20/2016   HGB 11.0 (L) 05/20/2016   HCT 30.9 (L) 05/20/2016   MCV 90.1 05/20/2016   PLT 261 05/20/2016   No results found for: INR, PROTIME   Anesthesia Physical Anesthesia Plan  ASA: II  Anesthesia Plan: Epidural   Post-op Pain Management:    Induction:   Airway Management Planned:   Additional Equipment:   Intra-op Plan:   Post-operative Plan:   Informed Consent: I have reviewed the patients History and Physical, chart, labs and discussed the procedure including the risks, benefits and alternatives for the proposed anesthesia with the patient or authorized representative who has indicated his/her understanding and acceptance.     Plan Discussed with:   Anesthesia Plan Comments:         Anesthesia Quick Evaluation

## 2016-05-20 NOTE — Anesthesia Procedure Notes (Signed)
Epidural Patient location during procedure: OB Start time: 05/20/2016 3:00 PM End time: 05/20/2016 3:08 PM  Staffing Anesthesiologist: Shona SimpsonHOLLIS, KEVIN D Performed: anesthesiologist   Preanesthetic Checklist Completed: patient identified, site marked, surgical consent, pre-op evaluation, timeout performed, IV checked, risks and benefits discussed and monitors and equipment checked  Epidural Patient position: sitting Prep: ChloraPrep Patient monitoring: heart rate, continuous pulse ox and blood pressure Approach: midline Location: L3-L4 Injection technique: LOR saline  Needle:  Needle type: Tuohy  Needle gauge: 17 G Needle length: 9 cm Catheter type: closed end flexible Catheter size: 20 Guage Test dose: negative and 1.5% lidocaine  Assessment Events: blood not aspirated, injection not painful, no injection resistance and no paresthesia  Additional Notes LOR @ 6  Patient identified. Risks/Benefits/Options discussed with patient including but not limited to bleeding, infection, nerve damage, paralysis, failed block, incomplete pain control, headache, blood pressure changes, nausea, vomiting, reactions to medications, itching and postpartum back pain. Confirmed with bedside nurse the patient's most recent platelet count. Confirmed with patient that they are not currently taking any anticoagulation, have any bleeding history or any family history of bleeding disorders. Patient expressed understanding and wished to proceed. All questions were answered. Sterile technique was used throughout the entire procedure. Please see nursing notes for vital signs. Test dose was given through epidural catheter and negative prior to continuing to dose epidural or start infusion. Warning signs of high block given to the patient including shortness of breath, tingling/numbness in hands, complete motor block, or any concerning symptoms with instructions to call for help. Patient was given instructions on fall  risk and not to get out of bed. All questions and concerns addressed with instructions to call with any issues or inadequate analgesia.    Reason for block:procedure for pain

## 2016-05-20 NOTE — H&P (Signed)
LABOR AND DELIVERY ADMISSION HISTORY AND PHYSICAL NOTE  Jocelyn Potts is a 31 y.o. female [phone] with IUP at 46w2dby LMP and 12 week [place]oreapresenting for IOL for  A1GDM, EFW 8+1 >90% (38wk). She reports positive fetal movement. She denies leakage of fluid or vaginal bleeding. Patient endorses use of cigs during pregnancy but denies THC use. No h/o previous medical problems or prenatal issues per pt. Was followed by [facility] Plans to have BLT with papers singed.  Prenatal History/Complications: Gestational diabetes type A1 Tobacco use during pregnancy (8 cigs daily) Positive THC by UDS during pregnancy GBS positivity during pregnancy Obesity in pregnancy  Past Medical History: Past Medical History:  Diagnosis Date  . Diabetes mellitus without complication ([place]   . Medical history non-contributory     Past Surgical History: Past Surgical History:  Procedure Laterality Date  . DILATION AND CURETTAGE OF UTERUS     Therapeutic Abortion x2    Obstetrical History: OB History    Gravida Para Term Preterm AB Living   '4 1 1   2 1   ' SAB TAB Ectopic Multiple Live Births     2     1      Social History: Social History   Social History  . Marital status: Single    Spouse name: N/A  . Number of children: N/A  . Years of education: N/A   Social History Main Topics  . Smoking status: Current Every Day Smoker    Packs/day: 0.25    Types: Cigarettes  . Smokeless tobacco: Never Used  . Alcohol use No  . Drug use: No  . Sexual activity: Yes    Birth control/ protection: None   Other Topics Concern  . None   Social History Narrative  . None    Family History: Family History  Problem Relation Age of Onset  . Asthma Mother   . Hypertension Father   . Cancer Maternal Grandmother     lung  . Cancer Paternal Grandfather     Bone    Allergies: No Known Allergies  Prescriptions Prior to Admission  Medication Sig Dispense Refill Last Dose  . calcium carbonate (TUMS  - DOSED IN MG ELEMENTAL CALCIUM) 500 MG chewable tablet Chew 1 tablet by mouth 2 (two) times daily as needed for indigestion or heartburn.   Past Week at Unknown time  . fexofenadine (ALLEGRA) 180 MG tablet Take 180 mg by mouth daily.   05/19/2016 at Unknown time  . Prenatal Vit-Fe Fumarate-FA (PNV PRENATAL PLUS MULTIVITAMIN) 27-1 MG TABS Take 1 tablet by mouth daily.  11 05/19/2016 at Unknown time  . pseudoephedrine-acetaminophen (TYLENOL SINUS) 30-500 MG TABS tablet Take 1 tablet by mouth every 4 (four) hours as needed (sinus headache).   Past Month at Unknown time  . ACCU-CHEK FASTCLIX LANCETS MISC 1 Units by Percutaneous route 4 (four) times daily. 100 each 12 Taking  . Blood Glucose Monitoring Suppl (ACCU-CHEK NANO SMARTVIEW) w/Device KIT 1 kit by Subdermal route as directed. Check blood sugars for fasting, and two hours after breakfast, lunch and dinner (4 checks daily) 1 kit 0 Taking  . Elastic Bandages & Supports (FUTURO ABDOMINAL SUPPORT) MISC 1 Device by Does not apply route as needed. (Patient not taking: Reported on 05/12/2016) 1 each 0 Not Taking  . glucose blood (ACCU-CHEK SMARTVIEW) test strip Use as instructed to check blood sugars 100 each 12 Taking     Review of Systems   All systems reviewed and  negative except as stated in HPI  Blood pressure 112/69, pulse 91, temperature 98.2 F (36.8 C), temperature source Oral, resp. rate 18, height '5\' 7"'  (1.702 m), weight 206 lb (93.4 kg), last menstrual period 08/26/2015, SpO2 100 %. General appearance: alert, cooperative and no distress Lungs: clear to auscultation bilaterally Heart: regular rate and rhythm Abdomen: soft, non-tender; bowel sounds normal Extremities: No calf swelling or tenderness Presentation: cephalic Fetal monitoring: baseline 135, accelerations present, no decelerations Uterine activity: regular q2-3 mins Dilation: 2.5 Effacement (%): 80 Station: -3 Exam by:: Myna Hidalgo, RN   Prenatal labs: ABO, Rh: A/POS/--  (09/14 1003) Antibody: NEG (09/14 1003) Rubella: Immune RPR: Non Reactive (01/04 0828)  HBsAg: NEGATIVE (09/14 1003)  HIV: Non Reactive (01/04 0828)  GBS: Positive (03/04 0000)  2 hr Glucola: Abnormal 136 fasting (3rd trimester) Genetic screening:  Normal Quad Anatomy US: Normal  Prenatal Transfer Tool  Maternal Diabetes: Yes:  Diabetes Type:  Diet controlled Genetic Screening: Normal Maternal Ultrasounds/Referrals: Normal Fetal Ultrasounds or other Referrals:  None Maternal Substance Abuse:  Yes:  Type: Smoker Significant Maternal Medications:  None Significant Maternal Lab Results: Lab values include: Group B Strep positive  Results for orders placed or performed during the hospital encounter of 05/20/16 (from the past 24 hour(s))  CBC   Collection Time: 05/20/16  8:20 AM  Result Value Ref Range   WBC 12.3 (H) 4.0 - 10.5 K/uL   RBC 3.43 (L) 3.87 - 5.11 MIL/uL   Hemoglobin 11.0 (L) 12.0 - 15.0 g/dL   HCT 30.9 (L) 36.0 - 46.0 %   MCV 90.1 78.0 - 100.0 fL   MCH 32.1 26.0 - 34.0 pg   MCHC 35.6 30.0 - 36.0 g/dL   RDW 14.1 11.5 - 15.5 %   Platelets 261 150 - 400 K/uL    Patient Active Problem List   Diagnosis Date Noted  . Gestational diabetes 05/20/2016  . Group B Streptococcus carrier, antepartum 05/02/2016  . Gestational diabetes mellitus (GDM) affecting pregnancy, antepartum 03/06/2016  . Obesity in pregnancy 01/08/2016  . Marijuana abuse 11/17/2015  . Supervision of high-risk pregnancy 11/13/2015  . Disorder of teeth and supporting structures 06/19/2009  . 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 OVERWEIGHT 09/05/2008  . TOBACCO ABUSE 09/05/2008  . LOW BACK PAIN, MILD 11/01/2006  . RHINITIS, ALLERGIC 04/28/2006    Assessment: Jocelyn Potts is a 31 y.o. [phone] at 73w2dhere for IOL due to A1GDM with EFW 8+1 (38wk).  #Labor:IOL, titrate pitocin to achieve adequate labor #Pain: Controlled without medications, epidural available #FWB: Category I #ID:  GBS  positive on PCN #MOF: Bottle #MOC:BLT (singed 03/05/15) #Circ:  Yes, outpatient #A1GDM: EFW 8+1 >90% (38wk), checking CBG q4h   DRandolph Bing DO, PGY-1 05/20/2016, 9:08 AM   OB FELLOW HISTORY AND PHYSICAL ATTESTATION  I confirm that I have verified the information documented in the resident's note and that I have also personally performed the physical exam and all medical decision making activities.      NJacquiline Doe3/22/2018, 10:14 AM

## 2016-05-20 NOTE — Anesthesia Preprocedure Evaluation (Signed)
Anesthesia Evaluation  Patient identified by MRN, date of birth, ID band Patient awake    Reviewed: Allergy & Precautions, H&P , Patient's Chart, lab work & pertinent test results, reviewed documented beta blocker date and time   Airway Mallampati: II  TM Distance: >3 FB Neck ROM: full    Dental no notable dental hx.    Pulmonary Current Smoker,    Pulmonary exam normal breath sounds clear to auscultation       Cardiovascular negative cardio ROS   Rhythm:regular Rate:Normal     Neuro/Psych  Headaches, PSYCHIATRIC DISORDERS    GI/Hepatic negative GI ROS, Neg liver ROS,   Endo/Other  diabetes, Gestational  Renal/GU negative Renal ROS  negative genitourinary   Musculoskeletal negative musculoskeletal ROS (+)   Abdominal   Peds negative pediatric ROS (+)  Hematology negative hematology ROS (+)   Anesthesia Other Findings   Reproductive/Obstetrics (+) Pregnancy                             Lab Results  Component Value Date   WBC 12.3 (H) 05/20/2016   HGB 11.0 (L) 05/20/2016   HCT 30.9 (L) 05/20/2016   MCV 90.1 05/20/2016   PLT 261 05/20/2016   No results found for: INR, PROTIME   Anesthesia Physical  Anesthesia Plan  ASA: II  Anesthesia Plan: Epidural   Post-op Pain Management:    Induction:   Airway Management Planned:   Additional Equipment:   Intra-op Plan:   Post-operative Plan:   Informed Consent: I have reviewed the patients History and Physical, chart, labs and discussed the procedure including the risks, benefits and alternatives for the proposed anesthesia with the patient or authorized representative who has indicated his/her understanding and acceptance.   Dental Advisory Given  Plan Discussed with: CRNA and Surgeon  Anesthesia Plan Comments:         Anesthesia Quick Evaluation

## 2016-05-20 NOTE — Progress Notes (Signed)
Pt to OR for bilateral  tubal ligation

## 2016-05-20 NOTE — Anesthesia Pain Management Evaluation Note (Signed)
  CRNA Pain Management Visit Note  Patient: Kriste BasqueAshley J Alcaide, 31 y.o., female  "Hello [facility] am a [facility]. We have an anesthesia team available at all times to provide care throughout the hospital, including epidural management and anesthesia for C-section. I don't know your plan for the delivery whether it a natural birth, water birth, IV sedation, nitrous supplementation, doula or epidural, but we want to meet your pain goals."   1.Was your pain managed to your expectations on prior hospitalizations?   Yes   2.What is your expectation for pain management during this hospitalization?     Epidural  3.How can we help you reach that goal?   Record the patient's initial score and the patient's pain goal.   Pain: 0  Pain Goal: 5 [facility] wants you to be able to say your pain was always managed very well.  Laban EmperorMalinova,Gleason Ardoin Hristova 05/20/2016

## 2016-05-20 NOTE — Progress Notes (Signed)
Jocelyn Potts is a 31 y.o. [phone] at 6050w2d   Subjective: Patient says contractions are more intense and frequent. Not yet ready for epidural. She is not sure if she wants it.  Objective: BP 125/74   Pulse 84   Temp 98.1 F (36.7 C) (Oral)   Resp 16   Ht 5\' 7"  (1.702 m)   Wt 206 lb (93.4 kg)   LMP 08/26/2015 (Exact Date)   SpO2 100%   BMI 32.26 kg/m  No intake/output data recorded. No intake/output data recorded.  FHT:  FHR: 130 bpm, variability: moderate,  accelerations:  Present,  decelerations:  Absent UC:   regular, every 2-4 minutes SVE:   Dilation: 3.5 Effacement (%): 80 Station: -2 Exam by:: ESiska, RN  Labs: Lab Results  Component Value Date   WBC 12.3 (H) 05/20/2016   HGB 11.0 (L) 05/20/2016   HCT 30.9 (L) 05/20/2016   MCV 90.1 05/20/2016   PLT 261 05/20/2016    Assessment / Plan: Jocelyn Potts is a 31 y.o. G4P1021 at 4850w2d here for IOL due to A1GDM with EFW 8+1 (38wk).  Labor: Progressing normally on pitocin, will continue to titrate to achieve adequate labor Preeclampsia:  None Fetal Wellbeing:  Category I Pain Control:  Labor support without medications I/D:  GBS positive on PCN Anticipated MOD:  NSVD  Wendee Beaversavid J McMullen, DO, PGY-1 05/20/2016, 1:07 PM

## 2016-05-21 ENCOUNTER — Encounter (HOSPITAL_COMMUNITY): Payer: Self-pay

## 2016-05-21 ENCOUNTER — Encounter (HOSPITAL_COMMUNITY): Payer: Self-pay | Admitting: Obstetrics & Gynecology

## 2016-05-21 LAB — RPR: RPR: NONREACTIVE

## 2016-05-21 MED ORDER — PNEUMOCOCCAL VAC POLYVALENT 25 MCG/0.5ML IJ INJ
0.5000 mL | INJECTION | INTRAMUSCULAR | Status: AC
  Administered 2016-05-22: 0.5 mL via INTRAMUSCULAR
  Filled 2016-05-21 (×2): qty 0.5

## 2016-05-21 MED ORDER — OXYCODONE-ACETAMINOPHEN 5-325 MG PO TABS
1.0000 | ORAL_TABLET | ORAL | Status: DC | PRN
  Administered 2016-05-21 – 2016-05-22 (×5): 1 via ORAL
  Filled 2016-05-21 (×5): qty 1

## 2016-05-21 NOTE — Progress Notes (Signed)
Patient ambulating.

## 2016-05-21 NOTE — Anesthesia Postprocedure Evaluation (Addendum)
Anesthesia Post Note  Patient: Jocelyn Potts  Procedure(s) Performed: * No procedures listed *  Patient location during evaluation: Mother Baby Anesthesia Type: Epidural Level of consciousness: awake Pain management: satisfactory to patient Vital Signs Assessment: post-procedure vital signs reviewed and stable Respiratory status: spontaneous breathing Cardiovascular status: stable Anesthetic complications: no        Last Vitals:  Vitals:   05/21/16 0028 05/21/16 0410  BP: 130/70 130/78  Pulse: 73 66  Resp: 18 16  Temp: 37.3 C 36.4 C    Last Pain:  Vitals:   05/21/16 0410  TempSrc: Oral  PainSc: 3    Pain Goal:                 KeyCorpBURGER,LINDA

## 2016-05-21 NOTE — Anesthesia Postprocedure Evaluation (Signed)
Anesthesia Post Note  Patient: Jocelyn Potts  Procedure(s) Performed: Procedure(s) (LRB): POST PARTUM TUBAL LIGATION (N/A)  Patient location during evaluation: PACU Anesthesia Type: Epidural Level of consciousness: awake Pain management: satisfactory to patient Vital Signs Assessment: post-procedure vital signs reviewed and stable Respiratory status: spontaneous breathing Cardiovascular status: blood pressure returned to baseline Postop Assessment: no headache and epidural receding Anesthetic complications: no       Last Vitals:  Vitals:   05/21/16 0028 05/21/16 0410  BP: 130/70 130/78  Pulse: 73 66  Resp: 18 16  Temp: 37.3 C 36.4 C    Last Pain:  Vitals:   05/21/16 1200  TempSrc:   PainSc: 0-No pain                 Janelly Switalski EDWARD

## 2016-05-21 NOTE — Clinical Social Work Maternal (Signed)
CLINICAL SOCIAL WORK MATERNAL/CHILD NOTE  Patient Details  Name: Jocelyn Potts MRN: [id number] Date of Birth: [birth year]  Date:  05/21/2016  Clinical Social Worker Initiating Note:  Blaine Hamper Date/ Time Initiated:  05/21/16/1322     Child's Name:  Jocelyn Potts   Legal Guardian:  Mother (FOB is Robinette Haines 01/21/1987)   Need for Interpreter:  None   Date of Referral:        Reason for Referral:  Current Substance Use/Substance Use During Pregnancy  (THC use during pregnancy. )   Referral Source:  Central Nursery   Address:  [address] [place] [address]  Phone number:  [phone]   Household Members:  Self, Minor Children, Significant Other   Natural Supports (not living in the home):  Immediate Family, Extended Family (FOB's immediate family will also be provide support. )   Professional Supports: None   Employment: Unemployed   Type of Work:     Education:  Associate Professor Resources:  Medicaid   Other Resources:  Sales executive , [employer]   Cultural/Religious Considerations Which May Impact Care:  Per [employer], MOB is Curator  Strengths:  Home prepared for child , Pediatrician chosen , Ability to meet basic needs    Risk Factors/Current Problems:  Substance Use    Cognitive State:  Alert , Able to Concentrate , Linear Thinking , Insightful    Mood/Affect:  Interested , Comfortable , Relaxed , Calm , Happy    CSW Assessment: CSW met with MOB to complete an assessment for hx THC.  MOB was receptive to meeting with CSW.  With MOB's permission, CSW asked FOB Robinette Haines) to step out of the room in effort to meet with MOB in private. CSW was bonding with infant during the assessment as evident by MOB engaging in skin to skin and infant massages. CSW inquired about MOB's substance use hx, and MOB acknowledged the use of marijuana prior to pregnancy confirmation.  MOB denied the use of any substance after pregnancy  confirmation. CSW informed MOB of the hospital's drug screen policy, and informed MOB of the 2 screenings for the infant. MOB appeared understanding and communicated she was not concerned about the infant having a positive UDS or CDS. CSW shared with MOB that the infant's UDS was negative and CSW will continue to monitor the infant's CDS. CSW made MOB aware that if the infant's CDS is positive without an explanation, CSW will make a report to [facility] CPS.  MOB did not have any questions regarding the hospital's policy. CSW offered MOB resources and referrals for SA, and MOB declined.  CSW educated MOB about PPD. CSW informed MOB of possible supports and interventions to decrease PPD.  CSW also encouraged MOB to seek medical attention if needed for increased signs and symptoms for PPD. CSW reviewed safe sleep, and SIDS. MOB was knowledgeable and asked appropriate questions.  MOB communicated that she has a bassinet for the baby, and feels prepared for the infant.  MOB did not have any further questions, concerns, or needs at this time. CSW thanked MOB for meeting with CSW and provided MOB with CSW contact information.  CSW Plan/Description:  Information/Referral to [facility] , No Further Intervention Required/No Barriers to Discharge, Patient/Family Education  (CSW will monitor infant's CDS and will make a report to CPS if needed. )   Blaine Hamper, MSW, LCSW Clinical Social Work [phone]    Barbara Cower, LCSW 05/21/2016, 1:25  PM

## 2016-05-21 NOTE — Progress Notes (Signed)
Patient called nurse to room after having a bowel movement and reportedly having passed a blood clot to the toilet. Fundus, firm, midline and scan bleeding with massage. Patient educated to advise nurse of changes in vaginal discharge.

## 2016-05-21 NOTE — Progress Notes (Signed)
POSTPARTUM PROGRESS NOTE  Post Partum Day #1 Subjective:  Jocelyn Potts is a 31 y.o. [phone] 782w2d s/p SVD and BTL.  No acute events overnight.  Pt denies problems with ambulating, voiding or po intake.  She denies nausea or vomiting.  Pain is moderately controlled.  She has had flatus. She has not had bowel movement.  Lochia Minimal.   Objective: Blood pressure 130/78, pulse 66, temperature 97.6 F (36.4 C), temperature source Oral, resp. rate 16, height 5\' 7"  (1.702 m), weight 206 lb (93.4 kg), last menstrual period 08/26/2015, SpO2 96 %, unknown if currently breastfeeding.  Physical Exam:  General: alert, cooperative and no distress Lochia:normal flow Chest: CTAB Heart: RRR no m/r/g Abdomen: +BS, soft, nontender,  Uterine Fundus: firm, nontender DVT Evaluation: No calf swelling or tenderness Extremities: no edema   Recent Labs  05/20/16 0820  HGB 11.0*  HCT 30.9*    Assessment/Plan:  ASSESSMENT: Jocelyn Potts is a 31 y.o. [id number] 202w2d s/p SVD and BTL  Plan for discharge tomorrow   LOS: 1 day   Gwenevere AbbotNimeka Phillip, MD Family Medicine Resident, PGY-2 [facility]  05/21/2016, 7:35 AM   Midwife attestation Post Partum Day 1, POD #1 I have seen and examined this patient and agree with above documentation in the resident's note.   Jocelyn Potts is a 31 y.o. [phone] s/p SVD and BTL.  Pt denies problems with ambulating, voiding or po intake. Pain is well controlled. Method of Feeding: bottle  PE:  Gen: well appearing Heart: reg rate Lungs: normal WOB Fundus firm Ext: soft, no pain, no edema  Assessment: S/p SVD & BTL PPD #1  Plan for discharge: tomorrow  Donette LarryMelanie Shanette Tamargo, CNM 9:51 AM

## 2016-05-22 MED ORDER — OXYCODONE-ACETAMINOPHEN 5-325 MG PO TABS: 1.0000 | ORAL_TABLET | ORAL | 0 refills | Status: DC | PRN

## 2016-05-22 MED ORDER — IBUPROFEN 600 MG PO TABS: 600.0000 mg | ORAL_TABLET | Freq: Four times a day (QID) | ORAL | 0 refills | Status: DC

## 2016-05-22 NOTE — Discharge Summary (Signed)
OB Discharge Summary     Patient Name: Jocelyn Potts DOB: [birth year] MRN: [id number]  Date of admission: 05/20/2016 Delivering MD: Waldemar Dickens   Date of discharge: 05/22/2016  Admitting diagnosis: INDUCTION PP BTL Intrauterine pregnancy: [redacted]w[redacted]d    Secondary diagnosis:  Active Problems:   Vaginal delivery  Additional problems: gestational diabetes (A1GDM)     Discharge diagnosis: Term Pregnancy Delivered                                                                                                Post partum procedures: post partum tubal ligation  Augmentation: AROM and Pitocin  Complications: None  Hospital course:  Induction of Labor With Vaginal Delivery   31y.o. yo [id number] 347w2das admitted to the hospital 05/20/2016 for induction of labor.  Indication for induction: A1 DM.  Patient had an uncomplicated labor course as follows: Membrane Rupture Time/Date: 2:16 PM ,05/20/2016   Intrapartum Procedures: Episiotomy: None [1]                                         Lacerations:  None [1]  Patient had delivery of a Viable infant.  Information for the patient's newborn:  LeEly, Spragg0[[id number]]    05/20/2016  Details of delivery can be found in separate delivery note.  Patient had a routine postpartum course. Patient is discharged home 05/22/16.  Physical exam  Vitals:   05/21/16 0410 05/21/16 1652 05/21/16 1840 05/22/16 0600  BP: 130/78 (!) 127/91 128/81 120/75  Pulse: 66 73 69 68  Resp: _0 Temp: 97.6 F (36.4 C) 98.2 F (36.8 C) 98.2 F (36.8 C) 98.5 F (36.9 C)  TempSrc: Oral  Oral Oral  SpO2: 96%     Weight:      Height:       General: alert, cooperative and no distress Lochia: appropriate Uterine Fundus: firm Incision: N/A DVT Evaluation: No evidence of DVT seen on physical exam. Trace pitting edema bilaterally  Labs: Lab Results  Component Value Date   WBC 12.3 (H) 05/20/2016   HGB 11.0 (L) 05/20/2016   HCT 30.9 (L)  05/20/2016   MCV 90.1 05/20/2016   PLT 261 05/20/2016   CMP Latest Ref Rng & Units 01/08/2016  Glucose 65 - 99 mg/dL 85  BUN 6 - 23 mg/dL -  Creatinine 0.50 - 1.10 mg/dL -  Sodium 137 - 147 mEq/L -  Potassium 3.7 - 5.3 mEq/L -  Chloride 96 - 112 mEq/L -  CO2 19 - 32 mEq/L -  Calcium 8.4 - 10.5 mg/dL -  Total Protein 6.0 - 8.3 g/dL -  Total Bilirubin 0.3 - 1.2 mg/dL -  Alkaline Phos 39 - 117 U/L -  AST 0 - 37 U/L -  ALT 0 - 35 U/L -    Discharge instruction: per After Visit Summary and "Baby and Me Booklet".  After visit meds:  Allergies as of 05/22/2016   No Known Allergies  Medication List    STOP taking these medications   ACCU-CHEK FASTCLIX LANCETS Misc   ACCU-CHEK NANO SMARTVIEW w/Device Kit   glucose blood test strip Commonly known as:  ACCU-CHEK SMARTVIEW     TAKE these medications   calcium carbonate 500 MG chewable tablet Commonly known as:  TUMS - dosed in mg elemental calcium Chew 1 tablet by mouth 2 (two) times daily as needed for indigestion or heartburn.   fexofenadine 180 MG tablet Commonly known as:  ALLEGRA Take 180 mg by mouth daily.   FUTURO ABDOMINAL SUPPORT Misc 1 Device by Does not apply route as needed.   ibuprofen 600 MG tablet Commonly known as:  ADVIL,MOTRIN Take 1 tablet (600 mg total) by mouth every 6 (six) hours.   oxyCODONE-acetaminophen 5-325 MG tablet Commonly known as:  PERCOCET/ROXICET Take 1 tablet by mouth every 4 (four) hours as needed for severe pain.   PNV PRENATAL PLUS MULTIVITAMIN 27-1 MG Tabs Take 1 tablet by mouth daily.   pseudoephedrine-acetaminophen 30-500 MG Tabs tablet Commonly known as:  TYLENOL SINUS Take 1 tablet by mouth every 4 (four) hours as needed (sinus headache).      Diet: routine diet  Activity: Advance as tolerated. Pelvic rest for 6 weeks.   Outpatient follow up:6 weeks Follow up Appt:No future appointments. Follow up Visit:No Follow-up on file.  Postpartum contraception: Tubal  Ligation  Newborn Data: Live born female  Birth Weight: 7 lb 12 oz (3515 g) APGAR: 9, 9  Baby Feeding: Breast Disposition:home with mother   05/22/2016 Lulu Riding, MD   CNM attestation I have seen and examined this patient and agree with above documentation in the resident's note.   Jocelyn Potts is a 31 y.o. [phone] s/p SVD/BTL.   Pain is well controlled.  Plan for birth control is bilateral tubal ligation.  Method of Feeding: bottle  PE:  BP 120/75   Pulse 68   Temp 98.5 F (36.9 C) (Oral)   Resp 18   Ht _0  (1.702 m)   Wt 93.4 kg (206 lb)   LMP 08/26/2015 (Exact Date)   SpO2 96%   Breastfeeding? Unknown   BMI 32.26 kg/m  Fundus firm   Recent Labs  05/20/16 0820  HGB 11.0*  HCT 30.9*     Plan: discharge today - postpartum care discussed - f/u clinic in 4-6 weeks for postpartum visit   Serita Grammes, CNM 9:44 AM 05/22/2016

## 2016-05-24 ENCOUNTER — Encounter: Payer: Medicaid Other | Admitting: Obstetrics & Gynecology

## 2016-05-25 ENCOUNTER — Inpatient Hospital Stay (HOSPITAL_COMMUNITY): Admission: RE | Admit: 2016-05-25 | Payer: Medicaid Other | Source: Ambulatory Visit

## 2016-07-01 ENCOUNTER — Encounter: Payer: Self-pay | Admitting: Obstetrics and Gynecology

## 2016-07-01 ENCOUNTER — Ambulatory Visit (INDEPENDENT_AMBULATORY_CARE_PROVIDER_SITE_OTHER): Payer: Medicaid Other | Admitting: Obstetrics and Gynecology

## 2016-07-01 VITALS — BP 146/81 | HR 76 | Ht 67.0 in | Wt 186.0 lb

## 2016-07-01 DIAGNOSIS — O165 Unspecified maternal hypertension, complicating the puerperium: Secondary | ICD-10-CM

## 2016-07-01 DIAGNOSIS — O24410 Gestational diabetes mellitus in pregnancy, diet controlled: Secondary | ICD-10-CM

## 2016-07-01 DIAGNOSIS — O99210 Obesity complicating pregnancy, unspecified trimester: Secondary | ICD-10-CM

## 2016-07-01 NOTE — Progress Notes (Signed)
Elevated BP today Tubal completed 05/20/2016 Vaginal delivery 05/20/16 with epidural PP Depression score - 4

## 2016-07-01 NOTE — Progress Notes (Signed)
Obstetrics Visit Postpartum Visit  Appointment Date: 07/01/2016  OB[facility] Clinic: [facility]  Primary Care Provider: None  Chief Complaint:  Chief Complaint  Patient presents with  . Postpartum Care    History of Present Illness: Jocelyn Potts is a 31 y.o. Caucasian [id number] (Patient's last menstrual period was 06/28/2016.), seen for the above chief complaint. Her past medical history is significant for h/o GDMa1, obesity in prgnancy   She is s/p SVD/intact perineum on 3/22 and a ppBTL on PPD#0; she was discharged to home on PPD#2.  She denies any s/s of pre-eclampsia.   Vaginal bleeding or discharge: Yes, currently on her period  Breast or formula feeding: formula Intercourse: No  Contraception after delivery: Yes  PP depression s/s: No  Any bowel or bladder issues: No  Pap smear: no abnormalities (date: 2017)  Review of Systems: as noted in the History of Present Illness.  Medications Jocelyn Potts had no medications administered during this visit. Current Outpatient Prescriptions  Medication Sig Dispense Refill  . calcium carbonate (TUMS - DOSED IN MG ELEMENTAL CALCIUM) 500 MG chewable tablet Chew 1 tablet by mouth 2 (two) times daily as needed for indigestion or heartburn.    . fexofenadine (ALLEGRA) 180 MG tablet Take 180 mg by mouth daily.    . Prenatal Vit-Fe Fumarate-FA (PNV PRENATAL PLUS MULTIVITAMIN) 27-1 MG TABS Take 1 tablet by mouth daily.  11  . ibuprofen (ADVIL,MOTRIN) 600 MG tablet Take 1 tablet (600 mg total) by mouth every 6 (six) hours. (Patient not taking: Reported on 07/01/2016) 30 tablet 0   No current facility-administered medications for this visit.     Allergies Patient has no known allergies.  Physical Exam:  BP (!) 146/81   Pulse 76   Ht 5\' 7"  (1.702 m)   Wt 186 lb (84.4 kg)   LMP 06/28/2016   Breastfeeding? No   BMI 29.13 kg/m  Body mass index is 29.13 kg/m. General appearance: Well nourished, well developed female in no  acute distress.  Respiratory:  Normal respiratory effort Abdomen: positive bowel sounds and no masses, hernias; diffusely non tender to palpation, non distended. Well healed infraumbilical incision Neuro/Psych:  Normal mood and affect.  Skin:  Warm and dry.   Laboratory: none  PP Depression Screening:  4, #10 zero  Assessment: pt doing well  Plan:  Pre-eclampsia precautions given. She is 6wks out from her delivery. Will rpt BP with 2hr GTT in 1wk. Pre-x precautions given. Pt getting good rest and has good help at home and seems to be adjusting well.   RTC 1wk BP and 2hr GTT  Jocelyn Copaharlie Hilda Rynders, Jr MD Attending [facility](Faculty Practice)

## 2016-07-08 ENCOUNTER — Other Ambulatory Visit (INDEPENDENT_AMBULATORY_CARE_PROVIDER_SITE_OTHER): Payer: Medicaid Other

## 2016-07-08 VITALS — BP 122/84

## 2016-07-08 DIAGNOSIS — O165 Unspecified maternal hypertension, complicating the puerperium: Secondary | ICD-10-CM

## 2016-07-08 DIAGNOSIS — O24419 Gestational diabetes mellitus in pregnancy, unspecified control: Secondary | ICD-10-CM

## 2016-07-08 NOTE — Progress Notes (Addendum)
Subjective:  Jocelyn Potts is a 31 y.o. female here for follow up BP check. Her BP at postpartum visit was 146/81. She has not had a history of HTN. She is also completing her postpartum 2h GTT due to history of GDM.  Vitals:   07/08/16 0904  BP: 122/84    Current Outpatient Prescriptions  Medication Sig Dispense Refill  . calcium carbonate (TUMS - DOSED IN MG ELEMENTAL CALCIUM) 500 MG chewable tablet Chew 1 tablet by mouth 2 (two) times daily as needed for indigestion or heartburn.    . fexofenadine (ALLEGRA) 180 MG tablet Take 180 mg by mouth daily.    Marland Kitchen. ibuprofen (ADVIL,MOTRIN) 600 MG tablet Take 1 tablet (600 mg total) by mouth every 6 (six) hours. (Patient not taking: Reported on 07/01/2016) 30 tablet 0  . Prenatal Vit-Fe Fumarate-FA (PNV PRENATAL PLUS MULTIVITAMIN) 27-1 MG TABS Take 1 tablet by mouth daily.  11   No current facility-administered medications for this visit.     Hypertension ROS: no chest pain on exertion, no dyspnea on exertion and no swelling of ankles.    Objective:  LMP 06/28/2016   Appearance alert, well appearing, and in no distress, oriented to person, place, and time and normal appearing weight. General exam BP noted to be well controlled today in office.    Assessment:   Elevated Blood Pressure Gestational Diabetes   Plan:  Current treatment plan is effective, no change in therapy. Follow up as needed.

## 2016-07-09 LAB — GLUCOSE TOLERANCE, 2 HOURS
GLUCOSE FASTING GTT: 77 mg/dL (ref 65–99)
Glucose, 2 hour: 69 mg/dL (ref 65–139)

## 2016-07-30 NOTE — Addendum Note (Signed)
Addendum  created 07/30/16 1118 by Arica Bevilacqua D, MD   Sign clinical note    

## 2016-09-10 NOTE — Anesthesia Postprocedure Evaluation (Signed)
Anesthesia Post Note  Patient: Jocelyn Potts  Procedure(s) Performed: Procedure(s) (LRB): POST PARTUM TUBAL LIGATION (N/A)     Anesthesia Post Evaluation  Last Vitals:  Vitals:   05/21/16 1840 05/22/16 0600  BP: 128/81 120/75  Pulse: 69 68  Resp: 18 18  Temp: 36.8 C 36.9 C    Last Pain:  Vitals:   05/22/16 0912  TempSrc:   PainSc: 2                  Abygayle Deltoro EDWARD

## 2016-09-10 NOTE — Addendum Note (Signed)
Addendum  created 09/10/16 1411 by Torie Towle, MD   Sign clinical note    

## 2018-02-10 IMAGING — US US MFM OB COMP +14 WKS
1 series · 14 of 28 positions shown · non-contrast
Comparison: none

[Series 1: us mfm ob comp +14 wks · 14 of 70 slices shown]
[im 3/70]
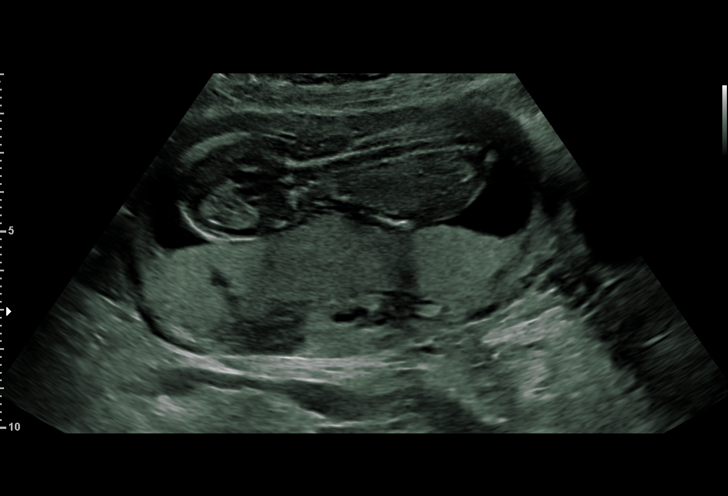
[im 8/70]
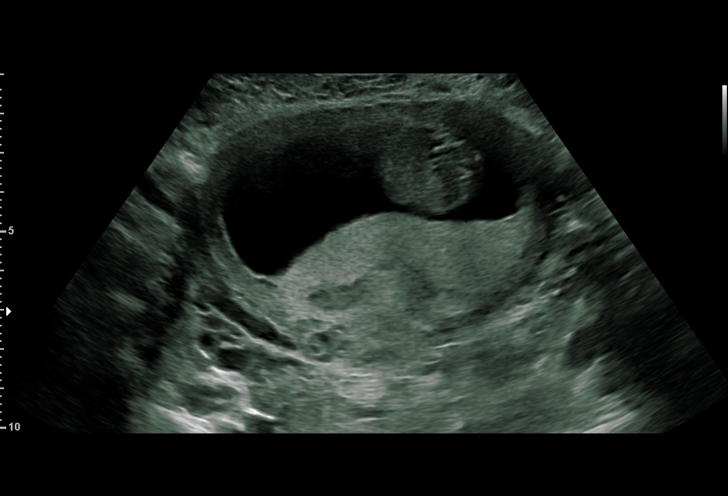
[im 13/70]
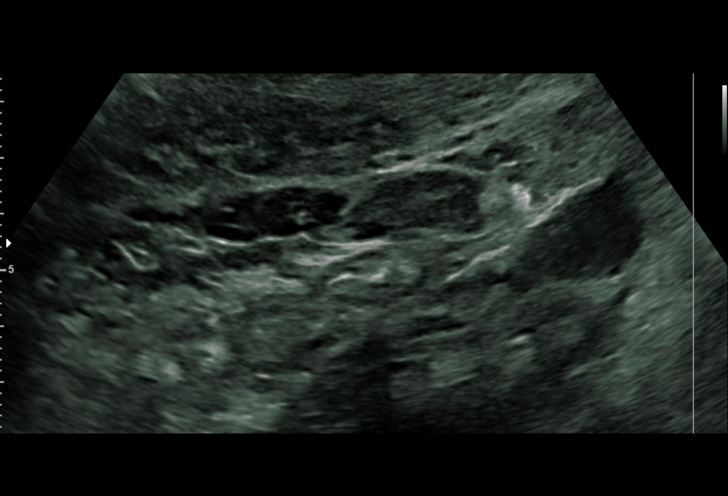
[im 18/70]
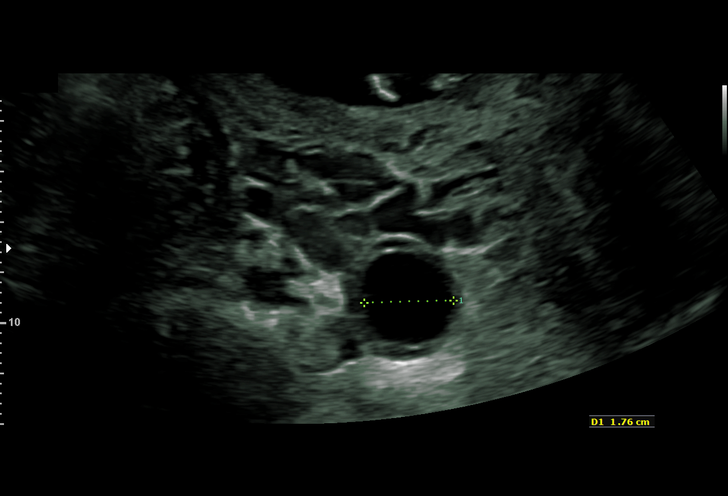
[im 24/70]
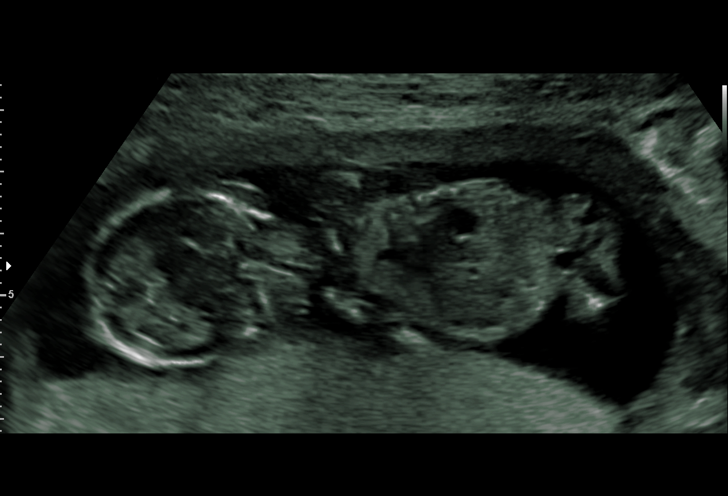
[im 29/70]
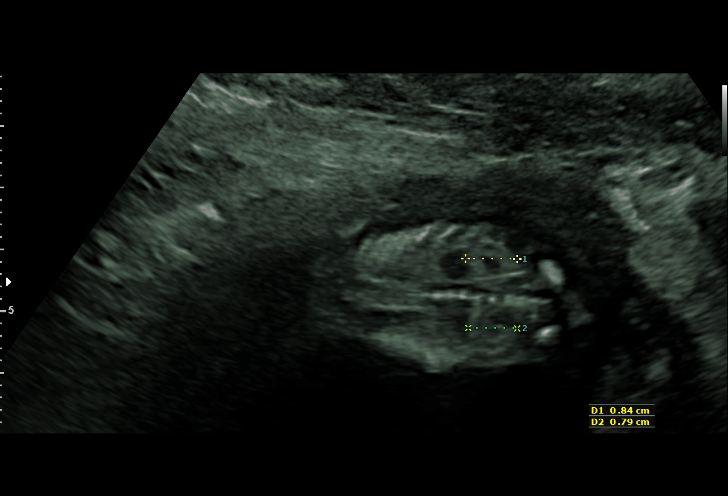
[im 34/70]
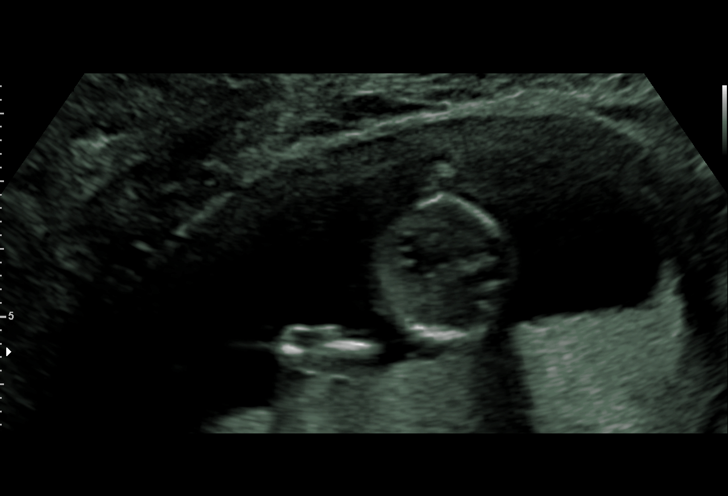
[im 39/70]
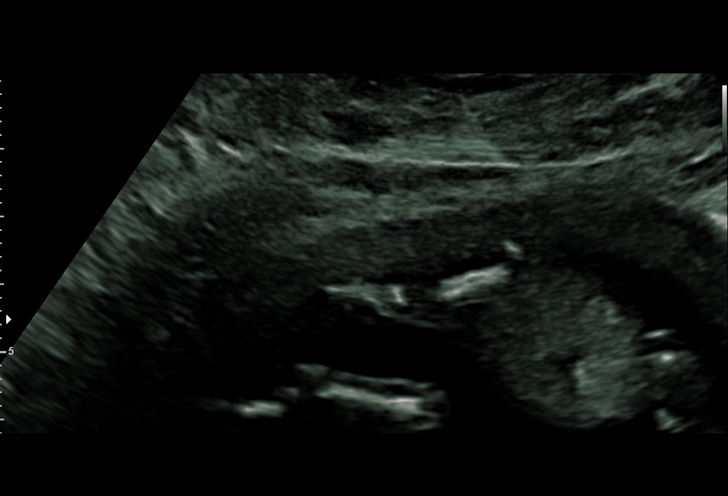
[im 44/70]
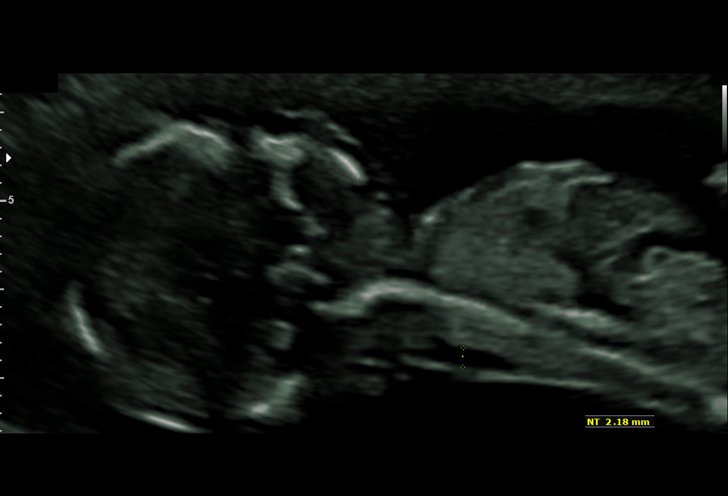
[im 49/70]
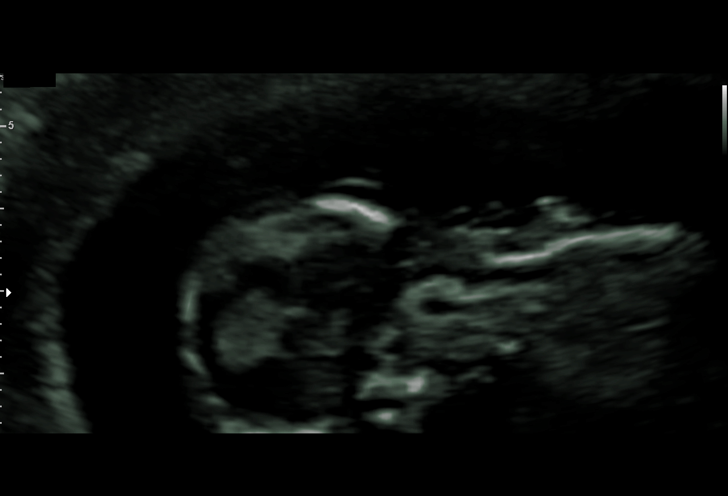
[im 54/70]
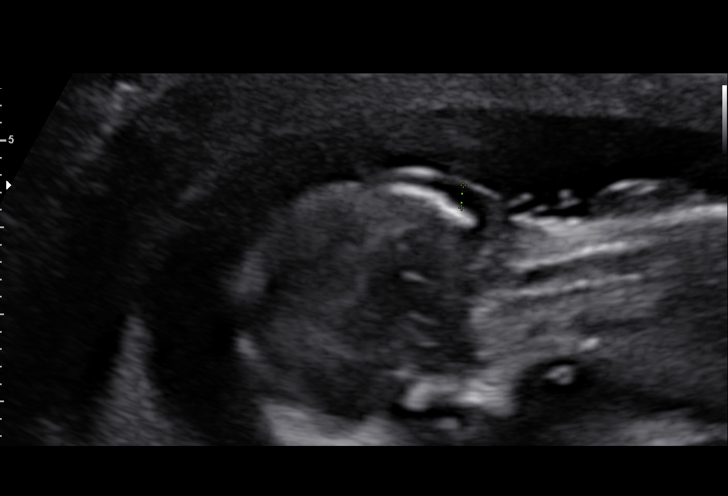
[im 59/70]
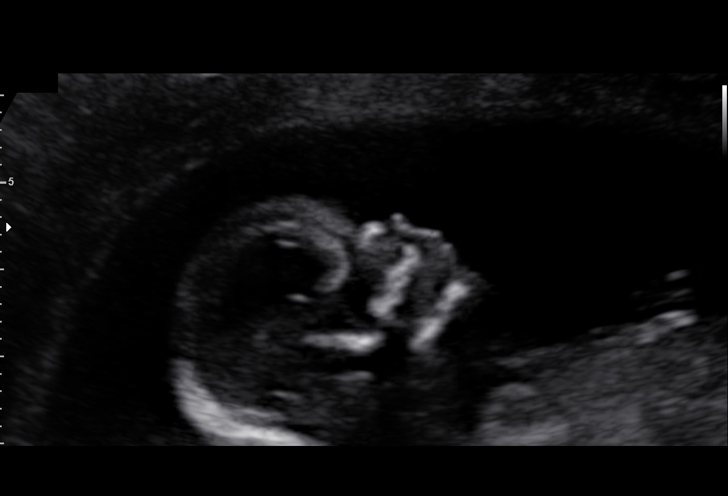
[im 64/70]
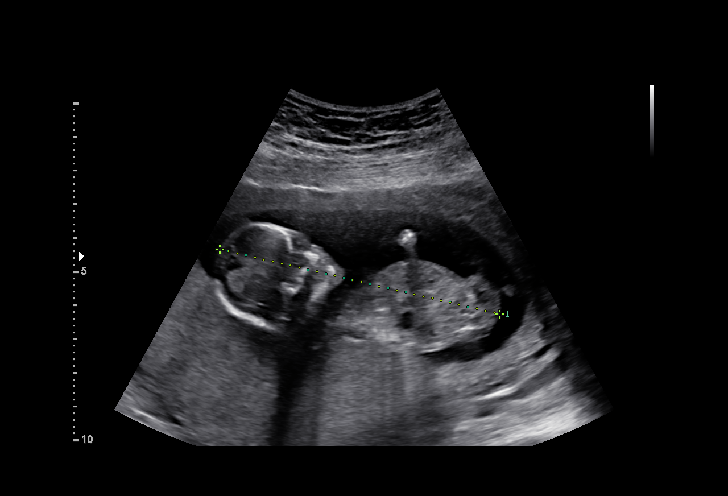
[im 70/70]
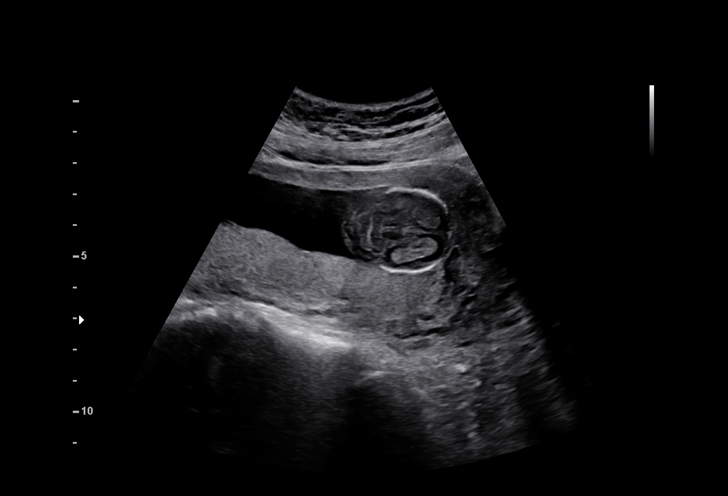

[14 of 28 positions shown; findings below may reference images not displayed]

1  OULIANA ABATZI          [id number]       [id number]     [id number]
Indications

14 weeks gestation of pregnancy
Basic anatomic survey                          Z36
OB History

Gravidity:    4         Term:   1
TOP:          2        Living:  1
Fetal Evaluation

Num Of Fetuses:     1
Fetal Heart         145
Rate(bpm):
Cardiac Activity:   Observed
Presentation:       Breech
Placenta:           Posterior

Amniotic Fluid
AFI FV:      Subjectively within normal limits

Largest Pocket(cm)
3.6
Biometry

CRL:      87.3  mm     G. Age:  14w 1d                  EDD:   05/25/16
Gestational Age

LMP:           13w 1d        Date:  08/26/15                 EDD:   06/01/16
Best:          14w 1d     Det. By:  U/S C R L (11/26/15)     EDD:   05/25/16
Anatomy

Cranium:               Appears normal         Stomach:                Appears normal, left
sided
Cavum:                 Appears normal         Abdomen:                Appears normal
Choroid Plexus:        Appears normal         Abdominal Wall:         Appears nml (cord
insert, abd wall)
Cerebellum:            Appears normal         Kidneys:                Appear normal
Face:                  Orbits appear          Bladder:                Appears normal
normal
Thoracic:              Appears normal         Upper Extremities:      Visualized
Heart:                 Echogenic focus        Lower Extremities:      Visualized
in LV
Diaphragm:             Appears normal

Other:  Technically difficult due to early gestational age.
Cervix Uterus Adnexa

Uterus
No abnormality visualized.

Left Ovary
Within normal limits.

Right Ovary
Simple cyst, measuring 2.3 x 1.8 x 1.8 cm
Impression

SIUP at 14+1 weeks
Normal but limited detailed fetal anatomy; no gross
abnormalities identified
Normal amniotic fluid volume
EDC based on today's measurements

CRL > 84 mms, therefore, first trimester screen could not be
performed.
Recommendations

Follow-up ultrasound in 4-5 weeks to complete anatomy
survey
Pt desires quad screen

## 2018-10-31 ENCOUNTER — Emergency Department (HOSPITAL_COMMUNITY)
Admission: EM | Admit: 2018-10-31 | Discharge: 2018-10-31 | Disposition: A | Discharge status: Home / Self Care | Payer: Medicaid Other | Attending: Emergency Medicine | Admitting: Emergency Medicine

## 2018-10-31 ENCOUNTER — Emergency Department (HOSPITAL_COMMUNITY): Payer: Medicaid Other

## 2018-10-31 ENCOUNTER — Encounter (HOSPITAL_COMMUNITY): Payer: Self-pay

## 2018-10-31 ENCOUNTER — Other Ambulatory Visit: Payer: Self-pay

## 2018-10-31 DIAGNOSIS — F17210 Nicotine dependence, cigarettes, uncomplicated: Secondary | ICD-10-CM | POA: Diagnosis not present

## 2018-10-31 DIAGNOSIS — H538 Other visual disturbances: Secondary | ICD-10-CM | POA: Diagnosis not present

## 2018-10-31 DIAGNOSIS — E119 Type 2 diabetes mellitus without complications: Secondary | ICD-10-CM | POA: Insufficient documentation

## 2018-10-31 DIAGNOSIS — R51 Headache: Secondary | ICD-10-CM | POA: Diagnosis present

## 2018-10-31 DIAGNOSIS — Z79899 Other long term (current) drug therapy: Secondary | ICD-10-CM | POA: Diagnosis not present

## 2018-10-31 DIAGNOSIS — H53149 Visual discomfort, unspecified: Secondary | ICD-10-CM | POA: Diagnosis not present

## 2018-10-31 DIAGNOSIS — R42 Dizziness and giddiness: Secondary | ICD-10-CM | POA: Diagnosis not present

## 2018-10-31 DIAGNOSIS — R112 Nausea with vomiting, unspecified: Secondary | ICD-10-CM | POA: Diagnosis not present

## 2018-10-31 DIAGNOSIS — R519 Headache, unspecified: Secondary | ICD-10-CM

## 2018-10-31 LAB — BASIC METABOLIC PANEL
Anion gap: 10 (ref 5–15)
BUN: 10 mg/dL (ref 6–20)
CO2: 21 mmol/L — ABNORMAL LOW (ref 22–32)
Calcium: 9.2 mg/dL (ref 8.9–10.3)
Chloride: 107 mmol/L (ref 98–111)
Creatinine, Ser: 0.8 mg/dL (ref 0.44–1.00)
GFR calc Af Amer: >60 mL/min (ref >60)
GFR calc non Af Amer: >60 mL/min (ref >60)
Glucose, Bld: 127 mg/dL — ABNORMAL HIGH (ref 70–99)
Potassium: 3.6 mmol/L (ref 3.5–5.1)
Sodium: 138 mmol/L (ref 135–145)

## 2018-10-31 LAB — CBC
HCT: 39.2 % (ref 36.0–46.0)
Hemoglobin: 12.7 g/dL (ref 12.0–15.0)
MCH: 29.2 pg (ref 26.0–34.0)
MCHC: 32.4 g/dL (ref 30.0–36.0)
MCV: 90.1 fL (ref 80.0–100.0)
Platelets: 284 10*3/uL (ref 150–400)
RBC: 4.35 MIL/uL (ref 3.87–5.11)
RDW: 13.7 % (ref 11.5–15.5)
WBC: 10 10*3/uL (ref 4.0–10.5)
nRBC: 0 % (ref 0.0–0.2)

## 2018-10-31 LAB — RAPID URINE DRUG SCREEN, HOSP PERFORMED
Amphetamines: NOT DETECTED
Barbiturates: NOT DETECTED
Benzodiazepines: NOT DETECTED
Cocaine: NOT DETECTED
Opiates: NOT DETECTED
Tetrahydrocannabinol: POSITIVE — AB

## 2018-10-31 LAB — URINALYSIS, ROUTINE W REFLEX MICROSCOPIC
Bilirubin Urine: NEGATIVE
Glucose, UA: NEGATIVE mg/dL
Ketones, ur: NEGATIVE mg/dL
Leukocytes,Ua: NEGATIVE
Nitrite: NEGATIVE
Protein, ur: 30 mg/dL — AB
RBC / HPF: >50 RBC/hpf — ABNORMAL HIGH (ref 0–5)
Specific Gravity, Urine: 1.012 (ref 1.005–1.030)
pH: 7 (ref 5.0–8.0)

## 2018-10-31 LAB — HCG, QUANTITATIVE, PREGNANCY: hCG, Beta Chain, Quant, S: 2 m[IU]/mL (ref <5)

## 2018-10-31 LAB — CBG MONITORING, ED: Glucose-Capillary: 97 mg/dL (ref 70–99)

## 2018-10-31 MED ORDER — PROCHLORPERAZINE EDISYLATE 10 MG/2ML IJ SOLN
10.0000 mg | Freq: Once | INTRAMUSCULAR | Status: AC
  Administered 2018-10-31: 10 mg via INTRAVENOUS
  Filled 2018-10-31: qty 2

## 2018-10-31 MED ORDER — IOHEXOL 350 MG/ML SOLN
75.0000 mL | Freq: Once | INTRAVENOUS | Status: AC | PRN
  Administered 2018-10-31: 100 mL via INTRAVENOUS

## 2018-10-31 MED ORDER — DIPHENHYDRAMINE HCL 50 MG/ML IJ SOLN
25.0000 mg | Freq: Once | INTRAMUSCULAR | Status: AC
  Administered 2018-10-31: 19:00:00 25 mg via INTRAVENOUS
  Filled 2018-10-31: qty 1

## 2018-10-31 MED ORDER — SODIUM CHLORIDE 0.9 % IV BOLUS
1000.0000 mL | Freq: Once | INTRAVENOUS | Status: AC
Start: 2018-10-31 — End: 2018-10-31
  Administered 2018-10-31: 1000 mL via INTRAVENOUS

## 2018-10-31 MED ORDER — SODIUM CHLORIDE 0.9% FLUSH: 3.0000 mL | Freq: Once | INTRAVENOUS | Status: DC

## 2018-10-31 NOTE — Discharge Instructions (Signed)
Your CT today was normal.  I have provided the number to neurology, please schedule an appointment for further management of your headaches.  The number to the [facility] wellness clinic is attached to paperwork, please establish primary care with them for further management of your medical conditions.   If you experience any vomiting, worsening headache, worsening symptoms please return to the emergency department.

## 2018-10-31 NOTE — ED Notes (Signed)
Patient transported to CT 

## 2018-10-31 NOTE — ED Triage Notes (Signed)
Pt reports she was at [facility] Saturday when her vision went blurry, she started seeing spots and her head started pounding. She stopped to eat something and her hand went numb. Since Saturday pt reports vision has improved but still has the headache. Pt reports she also has N/V. Pt reported she had her period last week but is now bleeding again. Pt reports feeling weak when walking. Pt denies feeling near syncopal but feels dizzy.

## 2018-10-31 NOTE — ED Provider Notes (Signed)
[facility] EMERGENCY DEPARTMENT Provider Note   CSN: [id number] Arrival date & time: 10/31/18  1429     History   Chief Complaint Chief Complaint  Patient presents with   Headache   Dizziness    HPI Jocelyn Potts is a 33 y.o. female.     33 y.o female with a PMH of DM, Marijuana Abuse, chronic tension headache presents to the ED with a chief complaint of headache, dizziness x 3 days. Patient reports she was at [facility] on Sunday with her daughter when she suddenly developed a throbbing constant headache to the right sided of her head along with some blurry vision. She reports thinking her sugar's were low so she drank some sweet tea, after this she reports feeling numbness and tingling to her left arm. She took some tylenol along with food with helped with symptoms. She reports feeling "funny" overall. Also had nausea and vomiting with this episode. She denies any fever, shortness of breath, prior history of blood clots.   The history is provided by the patient and medical records.    Past Medical History:  Diagnosis Date   Diabetes mellitus without complication [facility])    Medical history non-contributory     Patient Active Problem List   Diagnosis Date Noted   Gestational diabetes mellitus (GDM) affecting pregnancy, antepartum 03/06/2016   Marijuana abuse 11/17/2015   Disorder of teeth and supporting structures 06/19/2009   CHRONIC TENSION HEADACHE 10/14/2008   OVERWEIGHT 09/05/2008   TOBACCO ABUSE 09/05/2008   LOW BACK PAIN, MILD 11/01/2006   RHINITIS, ALLERGIC 04/28/2006    Past Surgical History:  Procedure Laterality Date   DILATION AND CURETTAGE OF UTERUS     Therapeutic Abortion x2   TUBAL LIGATION N/A 05/20/2016   Procedure: POST PARTUM TUBAL LIGATION;  Surgeon: Tereso Newcomer, MD;  Location: [facility];  Service: Gynecology;  Laterality: N/A;   WISDOM TOOTH EXTRACTION       OB History    Gravida  4   Para  2   Term  2   Preterm      AB  2   Living  2     SAB      TAB  2   Ectopic      Multiple  0   Live Births  2            Home Medications    Prior to Admission medications   Medication Sig Start Date End Date Taking? Authorizing Provider  calcium carbonate (TUMS - DOSED IN MG ELEMENTAL CALCIUM) 500 MG chewable tablet Chew 1 tablet by mouth 2 (two) times daily as needed for indigestion or heartburn.    [provider]  fexofenadine (ALLEGRA) 180 MG tablet Take 180 mg by mouth daily.    [provider]  ibuprofen (ADVIL,MOTRIN) 600 MG tablet Take 1 tablet (600 mg total) by mouth every 6 (six) hours. 05/22/16   Genice Rouge, MD  Prenatal Vit-Fe Fumarate-FA (PNV PRENATAL PLUS MULTIVITAMIN) 27-1 MG TABS Take 1 tablet by mouth daily. 02/12/16   [provider]    Family History Family History  Problem Relation Age of Onset   Asthma Mother    Hypertension Father    Cancer Maternal Grandmother        lung   Cancer Paternal Grandfather        Bone    Social History Social History   Tobacco Use   Smoking status: Current Every  Day Smoker    Packs/day: 0.25    Types: Cigarettes   Smokeless tobacco: Never Used  Substance Use Topics   Alcohol use: No   Drug use: No     Allergies   Patient has no known allergies.   Review of Systems Review of Systems  Constitutional: Negative for chills and fever.  HENT: Negative for ear pain and sore throat.   Eyes: Positive for photophobia and visual disturbance. Negative for pain.  Respiratory: Negative for cough and shortness of breath.   Cardiovascular: Negative for chest pain and palpitations.  Gastrointestinal: Negative for abdominal pain and vomiting.  Genitourinary: Negative for dysuria and hematuria.  Musculoskeletal: Negative for arthralgias and back pain.  Skin: Negative for color change and rash.  Neurological: Positive for dizziness, numbness and headaches. Negative for  seizures, syncope and weakness.  All other systems reviewed and are negative.    Physical Exam Updated Vital Signs BP 124/70 (BP Location: Right Arm)    Pulse 63    Temp 98.4 F (36.9 C)    Resp 18    SpO2 99%   Physical Exam Vitals signs and nursing note reviewed.  Constitutional:      General: She is not in acute distress.    Appearance: She is well-developed.  HENT:     Head: Normocephalic and atraumatic.     Mouth/Throat:     Pharynx: No oropharyngeal exudate.  Eyes:     Pupils: Pupils are equal, round, and reactive to light.  Neck:     Musculoskeletal: Normal range of motion.  Cardiovascular:     Rate and Rhythm: Regular rhythm.     Heart sounds: Normal heart sounds.  Pulmonary:     Effort: Pulmonary effort is normal. No respiratory distress.     Breath sounds: Normal breath sounds.  Abdominal:     General: Bowel sounds are normal. There is no distension.     Palpations: Abdomen is soft.     Tenderness: There is no abdominal tenderness.  Musculoskeletal:        General: No tenderness or deformity.     Right lower leg: No edema.     Left lower leg: No edema.  Skin:    General: Skin is warm and dry.  Neurological:     Mental Status: She is alert and oriented to person, place, and time.     Comments: Alert, oriented, thought content appropriate. Speech fluent without evidence of aphasia. Able to follow 2 step commands without difficulty.  Cranial Nerves:  II:  Peripheral visual fields grossly normal, pupils, round, reactive to light III,IV, VI: ptosis not present, extra-ocular motions intact bilaterally  V,VII: smile symmetric, facial light touch sensation equal VIII: hearing grossly normal bilaterally  IX,X: midline uvula rise  XI: bilateral shoulder shrug equal and strong XII: midline tongue extension  Motor:  5/5 in upper and lower extremities bilaterally including strong and equal grip strength and dorsiflexion/plantar flexion Sensory: light touch normal in  all extremities.  Cerebellar: normal finger-to-nose with bilateral upper extremities, pronator drift negative       ED Treatments / Results  Labs (all labs ordered are listed, but only abnormal results are displayed) Labs Reviewed  BASIC METABOLIC PANEL - Abnormal; Notable for the following components:      Result Value   CO2 21 (*)    Glucose, Bld 127 (*)    All other components within normal limits  URINALYSIS, ROUTINE W REFLEX MICROSCOPIC - Abnormal; Notable for the following  components:   Color, Urine AMBER (*)    APPearance HAZY (*)    Hgb urine dipstick LARGE (*)    Protein, ur 30 (*)    RBC / HPF >50 (*)    Bacteria, UA RARE (*)    All other components within normal limits  RAPID URINE DRUG SCREEN, HOSP PERFORMED - Abnormal; Notable for the following components:   Tetrahydrocannabinol POSITIVE (*)    All other components within normal limits  CBC  HCG, QUANTITATIVE, PREGNANCY  CBG MONITORING, ED    EKG None  Radiology Ct Angio Head W Or Wo Contrast  Result Date: 10/31/2018 CLINICAL DATA:  Episodic vertigo.  Headache. EXAM: CT ANGIOGRAPHY HEAD AND NECK TECHNIQUE: Multidetector CT imaging of the head and neck was performed using the standard protocol during bolus administration of intravenous contrast. Multiplanar CT image reconstructions and MIPs were obtained to evaluate the vascular anatomy. Carotid stenosis measurements (when applicable) are obtained utilizing NASCET criteria, using the distal internal carotid diameter as the denominator. CONTRAST:  100mL OMNIPAQUE IOHEXOL 350 MG/ML SOLN COMPARISON:  None. FINDINGS: CT HEAD FINDINGS Brain: No evidence of acute infarction, hemorrhage, hydrocephalus, extra-axial collection or mass lesion/mass effect. Vascular: Negative for hyperdense vessel Skull: Negative Sinuses: Negative Orbits: Negative Review of the MIP images confirms the above findings CTA NECK FINDINGS Aortic arch: Normal aortic arch. Four vessel arch with left  vertebral origin from the arch. Proximal great vessels widely patent. Right carotid system: Normal right carotid. Negative for stenosis or dissection. Left carotid system: Normal left carotid. Negative for stenosis or dissection Vertebral arteries: Both vertebral arteries are normal bilaterally. Skeleton: Negative Other neck: Mild thyroid enlargement.  No mass or adenopathy Upper chest: Lung apices clear bilaterally Review of the MIP images confirms the above findings CTA HEAD FINDINGS Anterior circulation: Cavernous carotid normal bilaterally without stenosis or calcification. Anterior and middle cerebral arteries normal bilaterally. Posterior circulation: Both vertebral arteries widely patent the basilar. PICA patent bilaterally. Basilar widely patent. AICA, superior cerebellar, posterior cerebral arteries patent bilaterally. Venous sinuses: Normal venous enhancement Anatomic variants: None Review of the MIP images confirms the above findings IMPRESSION: 1. Normal CT head 2. Normal CTA head and neck. Electronically Signed   By: Marlan Palauharles  Clark M.D.   On: 10/31/2018 21:28   Ct Angio Neck W And/or Wo Contrast  Result Date: 10/31/2018 CLINICAL DATA:  Episodic vertigo.  Headache. EXAM: CT ANGIOGRAPHY HEAD AND NECK TECHNIQUE: Multidetector CT imaging of the head and neck was performed using the standard protocol during bolus administration of intravenous contrast. Multiplanar CT image reconstructions and MIPs were obtained to evaluate the vascular anatomy. Carotid stenosis measurements (when applicable) are obtained utilizing NASCET criteria, using the distal internal carotid diameter as the denominator. CONTRAST:  100mL OMNIPAQUE IOHEXOL 350 MG/ML SOLN COMPARISON:  None. FINDINGS: CT HEAD FINDINGS Brain: No evidence of acute infarction, hemorrhage, hydrocephalus, extra-axial collection or mass lesion/mass effect. Vascular: Negative for hyperdense vessel Skull: Negative Sinuses: Negative Orbits: Negative Review of  the MIP images confirms the above findings CTA NECK FINDINGS Aortic arch: Normal aortic arch. Four vessel arch with left vertebral origin from the arch. Proximal great vessels widely patent. Right carotid system: Normal right carotid. Negative for stenosis or dissection. Left carotid system: Normal left carotid. Negative for stenosis or dissection Vertebral arteries: Both vertebral arteries are normal bilaterally. Skeleton: Negative Other neck: Mild thyroid enlargement.  No mass or adenopathy Upper chest: Lung apices clear bilaterally Review of the MIP images confirms the above findings CTA HEAD  FINDINGS Anterior circulation: Cavernous carotid normal bilaterally without stenosis or calcification. Anterior and middle cerebral arteries normal bilaterally. Posterior circulation: Both vertebral arteries widely patent the basilar. PICA patent bilaterally. Basilar widely patent. AICA, superior cerebellar, posterior cerebral arteries patent bilaterally. Venous sinuses: Normal venous enhancement Anatomic variants: None Review of the MIP images confirms the above findings IMPRESSION: 1. Normal CT head 2. Normal CTA head and neck. Electronically Signed   By: Marlan Palau M.D.   On: 10/31/2018 21:28    Procedures Procedures (including critical care time)  Medications Ordered in ED Medications  sodium chloride flush (NS) 0.9 % injection 3 mL (3 mLs Intravenous Not Given 10/31/18 1700)  sodium chloride 0.9 % bolus 1,000 mL (1,000 mLs Intravenous New Bag/Given 10/31/18 1922)  diphenhydrAMINE (BENADRYL) injection 25 mg (25 mg Intravenous Given 10/31/18 1921)  prochlorperazine (COMPAZINE) injection 10 mg (10 mg Intravenous Given 10/31/18 1921)  iohexol (OMNIPAQUE) 350 MG/ML injection 75 mL (100 mLs Intravenous Contrast Given 10/31/18 2106)     Initial Impression / Assessment and Plan / ED Course  I have reviewed the triage vital signs and the nursing notes.  Pertinent labs & imaging results that were available during my  care of the patient were reviewed by me and considered in my medical decision making (see chart for details).  Clinical Course as of Oct 30 2205  Tue Oct 31, 2018  2159 Glucose(!): 127 [JS]    Clinical Course User Index [JS] Claude Manges, [place]    Patient with a prior history of chronic headaches presents to the ED s/p sudden onset of headache x3 days at [facility]. She reports feeling over all "funny" and dizzy. She reports episodes having some left arm numbness which then subsided.  Patient denies any smoking, previous history of clots, periods history of TIAs or strokes.  She does report some improvement with over-the-counter medication.  Does have a history of previous migraines, reports this seemed out of the ordinary for her migraines.  She arrived in the ED normotensive, no tachycardia, no hypoxia, afebrile. Differential diagnosis included but not limited to migraine, tension headache, carotid dissection, hemorrhage.  CBC showed no leukocytosis, hemoglobin is within normal limits.  BMP showed no electrolyte derangement, glucose is slightly elevated.  Creatinine level is within normal limits.  UDS positive for THC.  UA showed large hemoglobin, greater than 50 red blood cells, rare bacteria, patient is currently on her menstrual cycle.  Pregnancy test is negative.  CT angios head and CT neck obtained to further evaluate her headache, she was provided with a headache cocktail such as Benadryl, Compazine along with a liter of fluids. CT Head and Neck: 1. Normal CT head  2. Normal CTA head and neck.     These results were explained to patient at length, she reports improvement in headache after headache cocktail.  Patient does not have a neurologist that follows her for her headaches, I have advised her that I will provide her with a number for follow-up.  She also urges the need to be linked up with a PCP, will provide her the [facility] health and wellness clinic.  Patient was seen and ambulated in  the ED, has a steady gait, vital signs remained within normal limits.  Patient is stable for discharge with outpatient follow-up.  Return precautions provided at length.   Final Clinical Impressions(s) / ED Diagnoses   Final diagnoses:  Bad headache  Dizziness    ED Discharge Orders    None  Claude MangesSoto, Triniti Gruetzmacher, PA-C 10/31/18 2207    Arby BarrettePfeiffer, Marcy, MD 11/04/18 1745

## 2019-08-27 ENCOUNTER — Other Ambulatory Visit: Payer: Self-pay

## 2019-08-27 ENCOUNTER — Ambulatory Visit (INDEPENDENT_AMBULATORY_CARE_PROVIDER_SITE_OTHER): Payer: Medicaid Other | Admitting: Obstetrics & Gynecology

## 2019-08-27 ENCOUNTER — Other Ambulatory Visit (HOSPITAL_COMMUNITY)
Admission: RE | Admit: 2019-08-27 | Discharge: 2019-08-27 | Disposition: A | Discharge status: Home / Self Care | Payer: Medicaid Other | Source: Ambulatory Visit | Attending: Obstetrics & Gynecology | Admitting: Obstetrics & Gynecology

## 2019-08-27 ENCOUNTER — Encounter: Payer: Self-pay | Admitting: Obstetrics & Gynecology

## 2019-08-27 VITALS — BP 131/85 | HR 118 | Ht 67.0 in | Wt 200.0 lb

## 2019-08-27 DIAGNOSIS — Z01419 Encounter for gynecological examination (general) (routine) without abnormal findings: Secondary | ICD-10-CM | POA: Diagnosis present

## 2019-08-27 DIAGNOSIS — R102 Pelvic and perineal pain: Secondary | ICD-10-CM

## 2019-08-27 LAB — POCT URINALYSIS DIPSTICK
Blood, UA: NEGATIVE
Leukocytes, UA: NEGATIVE

## 2019-08-27 NOTE — Progress Notes (Signed)
poctHaving right pelvic pain on and off for about a year, and it seems to happen 2 weeks before her cycle or after her cycle

## 2019-08-27 NOTE — Progress Notes (Signed)
Patient ID: Jocelyn Potts, female   DOB: 1985-04-24, 34 y.o.   MRN: [id number]  Chief Complaint  Patient presents with  . Gynecologic Exam   RLQ pain HPI Jocelyn Potts is a 34 y.o. female.  [id number] Patient's last menstrual period was 07/31/2019 (exact date). S/p BTL, regular menses with occasional midcycle RLQ pain that is mostly associated with activity, exertion.  HPI  Past Medical History:  Diagnosis Date  . Diabetes mellitus without complication (HCC)   . Medical history non-contributory     Past Surgical History:  Procedure Laterality Date  . DILATION AND CURETTAGE OF UTERUS     Therapeutic Abortion x2  . TUBAL LIGATION N/A 05/20/2016   Procedure: POST PARTUM TUBAL LIGATION;  Surgeon: Tereso Newcomer, MD;  Location: [facility];  Service: Gynecology;  Laterality: N/A;  . WISDOM TOOTH EXTRACTION      Family History  Problem Relation Age of Onset  . Asthma Mother   . Hypertension Father   . Cancer Maternal Grandmother        lung  . Cancer Paternal Grandfather        Bone    Social History Social History   Tobacco Use  . Smoking status: Current Every Day Smoker    Packs/day: 0.25    Types: Cigarettes  . Smokeless tobacco: Never Used  Vaping Use  . Vaping Use: Never used  Substance Use Topics  . Alcohol use: No  . Drug use: No    No Known Allergies  Current Outpatient Medications  Medication Sig Dispense Refill  . fexofenadine (ALLEGRA) 180 MG tablet Take 180 mg by mouth daily.    Marland Kitchen ibuprofen (ADVIL,MOTRIN) 600 MG tablet Take 1 tablet (600 mg total) by mouth every 6 (six) hours. 30 tablet 0  . calcium carbonate (TUMS - DOSED IN MG ELEMENTAL CALCIUM) 500 MG chewable tablet Chew 1 tablet by mouth 2 (two) times daily as needed for indigestion or heartburn. (Patient not taking: Reported on 08/27/2019)    . Prenatal Vit-Fe Fumarate-FA (PNV PRENATAL PLUS MULTIVITAMIN) 27-1 MG TABS Take 1 tablet by mouth daily. (Patient not taking: Reported on 08/27/2019)   11   No current facility-administered medications for this visit.    Review of Systems Review of Systems  Respiratory: Negative.   Cardiovascular: Negative.   Gastrointestinal: Positive for abdominal pain. Negative for constipation and diarrhea.  Genitourinary: Positive for dysuria (occasional). Negative for menstrual problem and vaginal discharge.    Blood pressure 131/85, pulse (!) 118, height 5\' 7"  (1.702 m), weight 200 lb (90.7 kg), last menstrual period 07/31/2019.  Physical Exam Physical Exam Constitutional:      Appearance: Normal appearance.  HENT:     Head: Normocephalic.  Eyes:     Pupils: Pupils are equal, round, and reactive to light.  Cardiovascular:     Rate and Rhythm: Normal rate.  Pulmonary:     Effort: Pulmonary effort is normal.  Abdominal:     General: Abdomen is flat.     Palpations: Abdomen is soft.     Tenderness: There is no abdominal tenderness.  Genitourinary:    General: Normal vulva.     Vagina: No vaginal discharge.     Comments: Pelvic exam: normal external genitalia, vulva, vagina, cervix, uterus and adnexa.  Musculoskeletal:     Cervical back: Normal range of motion.  Skin:    General: Skin is warm and dry.  Neurological:     Mental Status: She is alert.  Psychiatric:  Mood and Affect: Mood normal.   Breasts: breasts appear normal, no suspicious masses, no skin or nipple changes or axillary nodes.   Data Reviewed Pap results  Assessment Pelvic pain in female - Plan: US PELVIC COMPLETE WITH TRANSVAGINAL, POCT Urinalysis Dipstick  Well woman exam with routine gynecological exam - Plan: Cytology - PAP, CBC, TSH, Hemoglobin A1c, Lipid panel, Comprehensive metabolic panel Pelvic exam is benign but pain may have some cyclic characteristic. Suspect MS etiology   Plan [place] is ordered If negative consider evaluation by PT    Emeterio Reeve 08/27/2019, 2:43 PM

## 2019-08-28 LAB — CBC
Hematocrit: 38.5 % (ref 34.0–46.6)
Hemoglobin: 12.7 g/dL (ref 11.1–15.9)
MCH: 30.2 pg (ref 26.6–33.0)
MCHC: 33 g/dL (ref 31.5–35.7)
MCV: 92 fL (ref 79–97)
Platelets: 248 10*3/uL (ref 150–450)
RBC: 4.2 x10E6/uL (ref 3.77–5.28)
RDW: 12.9 % (ref 11.7–15.4)
WBC: 10.6 10*3/uL (ref 3.4–10.8)

## 2019-08-28 LAB — COMPREHENSIVE METABOLIC PANEL
ALT: 8 IU/L (ref 0–32)
AST: 12 IU/L (ref 0–40)
Albumin/Globulin Ratio: 1.7 (ref 1.2–2.2)
Albumin: 4.4 g/dL (ref 3.8–4.8)
Alkaline Phosphatase: 79 IU/L (ref 48–121)
BUN/Creatinine Ratio: 15 (ref 9–23)
BUN: 10 mg/dL (ref 6–20)
Bilirubin Total: 0.4 mg/dL (ref 0.0–1.2)
CO2: 22 mmol/L (ref 20–29)
Calcium: 9.1 mg/dL (ref 8.7–10.2)
Chloride: 103 mmol/L (ref 96–106)
Creatinine, Ser: 0.67 mg/dL (ref 0.57–1.00)
GFR calc Af Amer: 135 mL/min/{1.73_m2} (ref >59)
GFR calc non Af Amer: 117 mL/min/{1.73_m2} (ref >59)
Globulin, Total: 2.6 g/dL (ref 1.5–4.5)
Glucose: 99 mg/dL (ref 65–99)
Potassium: 4 mmol/L (ref 3.5–5.2)
Sodium: 137 mmol/L (ref 134–144)
Total Protein: 7 g/dL (ref 6.0–8.5)

## 2019-08-28 LAB — LIPID PANEL
Chol/HDL Ratio: 3.1 ratio (ref 0.0–4.4)
Cholesterol, Total: 157 mg/dL (ref 100–199)
HDL: 51 mg/dL (ref >39)
LDL Chol Calc (NIH): 92 mg/dL (ref 0–99)
Triglycerides: 70 mg/dL (ref 0–149)
VLDL Cholesterol Cal: 14 mg/dL (ref 5–40)

## 2019-08-28 LAB — CYTOLOGY - PAP
Comment: NEGATIVE
Diagnosis: NEGATIVE
High risk HPV: NEGATIVE

## 2019-08-28 LAB — TSH: TSH: 0.728 u[IU]/mL (ref 0.450–4.500)

## 2019-08-28 LAB — HEMOGLOBIN A1C
Est. average glucose Bld gHb Est-mCnc: 105 mg/dL
Hgb A1c MFr Bld: 5.3 % (ref 4.8–5.6)

## 2019-08-29 ENCOUNTER — Ambulatory Visit
Admission: RE | Admit: 2019-08-29 | Discharge: 2019-08-29 | Disposition: A | Discharge status: Home / Self Care | Payer: Medicaid Other | Source: Ambulatory Visit | Attending: Obstetrics & Gynecology | Admitting: Obstetrics & Gynecology

## 2019-08-29 DIAGNOSIS — R102 Pelvic and perineal pain: Secondary | ICD-10-CM

## 2019-10-06 ENCOUNTER — Emergency Department (HOSPITAL_COMMUNITY)
Admission: EM | Admit: 2019-10-06 | Discharge: 2019-10-06 | Disposition: A | Discharge status: Home / Self Care | Payer: Medicaid Other | Attending: Emergency Medicine | Admitting: Emergency Medicine

## 2019-10-06 ENCOUNTER — Encounter (HOSPITAL_COMMUNITY): Payer: Self-pay | Admitting: Emergency Medicine

## 2019-10-06 ENCOUNTER — Other Ambulatory Visit: Payer: Self-pay

## 2019-10-06 DIAGNOSIS — E119 Type 2 diabetes mellitus without complications: Secondary | ICD-10-CM | POA: Insufficient documentation

## 2019-10-06 DIAGNOSIS — K921 Melena: Secondary | ICD-10-CM | POA: Diagnosis not present

## 2019-10-06 DIAGNOSIS — R1031 Right lower quadrant pain: Secondary | ICD-10-CM | POA: Diagnosis not present

## 2019-10-06 DIAGNOSIS — R109 Unspecified abdominal pain: Secondary | ICD-10-CM | POA: Diagnosis present

## 2019-10-06 DIAGNOSIS — Z79899 Other long term (current) drug therapy: Secondary | ICD-10-CM | POA: Insufficient documentation

## 2019-10-06 DIAGNOSIS — K922 Gastrointestinal hemorrhage, unspecified: Secondary | ICD-10-CM | POA: Insufficient documentation

## 2019-10-06 DIAGNOSIS — F17210 Nicotine dependence, cigarettes, uncomplicated: Secondary | ICD-10-CM | POA: Diagnosis not present

## 2019-10-06 LAB — CBC
HCT: 37.6 % (ref 36.0–46.0)
Hemoglobin: 12.1 g/dL (ref 12.0–15.0)
MCH: 29.7 pg (ref 26.0–34.0)
MCHC: 32.2 g/dL (ref 30.0–36.0)
MCV: 92.2 fL (ref 80.0–100.0)
Platelets: 274 10*3/uL (ref 150–400)
RBC: 4.08 MIL/uL (ref 3.87–5.11)
RDW: 13.7 % (ref 11.5–15.5)
WBC: 7.9 10*3/uL (ref 4.0–10.5)
nRBC: 0 % (ref 0.0–0.2)

## 2019-10-06 LAB — POC OCCULT BLOOD, ED: Fecal Occult Bld: NEGATIVE

## 2019-10-06 LAB — COMPREHENSIVE METABOLIC PANEL
ALT: 14 U/L (ref 0–44)
AST: 14 U/L — ABNORMAL LOW (ref 15–41)
Albumin: 4 g/dL (ref 3.5–5.0)
Alkaline Phosphatase: 66 U/L (ref 38–126)
Anion gap: 9 (ref 5–15)
BUN: 11 mg/dL (ref 6–20)
CO2: 23 mmol/L (ref 22–32)
Calcium: 9 mg/dL (ref 8.9–10.3)
Chloride: 105 mmol/L (ref 98–111)
Creatinine, Ser: 0.77 mg/dL (ref 0.44–1.00)
GFR calc Af Amer: >60 mL/min (ref >60)
GFR calc non Af Amer: >60 mL/min (ref >60)
Glucose, Bld: 107 mg/dL — ABNORMAL HIGH (ref 70–99)
Potassium: 3.8 mmol/L (ref 3.5–5.1)
Sodium: 137 mmol/L (ref 135–145)
Total Bilirubin: 0.3 mg/dL (ref 0.3–1.2)
Total Protein: 6.9 g/dL (ref 6.5–8.1)

## 2019-10-06 LAB — TYPE AND SCREEN
ABO/RH(D): A POS
Antibody Screen: NEGATIVE

## 2019-10-06 LAB — I-STAT BETA HCG BLOOD, ED (MC, WL, AP ONLY): I-stat hCG, quantitative: <5 m[IU]/mL (ref <5)

## 2019-10-06 LAB — URINALYSIS, ROUTINE W REFLEX MICROSCOPIC
Bilirubin Urine: NEGATIVE
Glucose, UA: NEGATIVE mg/dL
Hgb urine dipstick: NEGATIVE
Ketones, ur: NEGATIVE mg/dL
Leukocytes,Ua: NEGATIVE
Nitrite: NEGATIVE
Protein, ur: NEGATIVE mg/dL
Specific Gravity, Urine: 1.008 (ref 1.005–1.030)
pH: 7 (ref 5.0–8.0)

## 2019-10-06 LAB — LIPASE, BLOOD: Lipase: 32 U/L (ref 11–51)

## 2019-10-06 MED ORDER — SODIUM CHLORIDE 0.9% FLUSH: 3.0000 mL | Freq: Once | INTRAVENOUS | Status: DC

## 2019-10-06 NOTE — Discharge Instructions (Signed)
Please call and follow-up with gastroenterologist for further evaluation of your rectal bleeding.  You may benefit from a colonoscopy.  Return to the ER if your symptoms worsen or if you have any other concern.  Avoid anti-inflammatory medication at this time and take Tylenol as needed for pain.  You may continue using Preparation H if it provide comfort.

## 2019-10-06 NOTE — ED Triage Notes (Signed)
Pt in w/sharp RLQ pain x 1 wk. States she began having bright red stools yesterday, some clots noted. Pain radiates throughout abdomen now, c/o some nausea. Pt does have hemorrhoids postpartum. Denies any recent black stools, but mentions them in past

## 2019-10-06 NOTE — ED Notes (Signed)
PA assessed and discharged pt before nurse could assess.

## 2019-10-06 NOTE — ED Provider Notes (Signed)
[facility] EMERGENCY DEPARTMENT Provider Note   CSN: [id number] Arrival date & time: 10/06/19  0901     History Chief Complaint  Patient presents with  . Abdominal Pain  . Rectal Bleeding    Jocelyn Potts is a 34 y.o. female.  The history is provided by the patient. No language interpreter was used.  Abdominal Pain Associated symptoms: hematochezia   Rectal Bleeding Associated symptoms: abdominal pain      34 year old female significant history of diabetes, marijuana use, obesity, presenting for evaluation abdominal pain.  Patient report intermittent lower abdominal pain ongoing for the past week.  She described pain as a crampy uncomfortable sensation with occasional bloatedness and indigestion as well as noticing bowel movement mixed with blood.  She initially quantify small drops of blood for the past 2 to 3 days but now noticing more bleeding as well as clots.  She endorsed mild rectal irritation without rectal pain.  Her pain is waxing and waning, seems to improve when she sits and worse with movement.  No associated fever or chills no chest pain shortness of breath productive cough no dysuria or hematuria.  She just recently finished her menstrual period.  She endorsed history of hemorrhoid from pregnancy in the past.  She denies any recent injury and denies any new sexual partner.  She did try taking anti-inflammatory medication several days ago when she noticed increasing bleeding.  She denies alcohol abuse.  She still has an intact appendix.  Patient denies constipation.  Patient also mention been evaluated for her low abdominal pain by her OB/GYN less than a month ago.  She has had a pelvic examination as well as pelvic ultrasound and states no acute finding was noted.  Past Medical History:  Diagnosis Date  . Diabetes mellitus without complication (HCC)   . Medical history non-contributory     Patient Active Problem List   Diagnosis Date Noted  .  Marijuana abuse 11/17/2015  . Disorder of teeth and supporting structures 06/19/2009  . 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 OVERWEIGHT 09/05/2008  . TOBACCO ABUSE 09/05/2008  . LOW BACK PAIN, MILD 11/01/2006  . RHINITIS, ALLERGIC 04/28/2006    Past Surgical History:  Procedure Laterality Date  . DILATION AND CURETTAGE OF UTERUS     Therapeutic Abortion x2  . TUBAL LIGATION N/A 05/20/2016   Procedure: POST PARTUM TUBAL LIGATION;  Surgeon: Tereso Newcomer, MD;  Location: [facility];  Service: Gynecology;  Laterality: N/A;  . WISDOM TOOTH EXTRACTION       OB History    Gravida  4   Para  2   Term  2   Preterm      AB  2   Living  2     SAB      TAB  2   Ectopic      Multiple  0   Live Births  2           Family History  Problem Relation Age of Onset  . Asthma Mother   . Hypertension Father   . Cancer Maternal Grandmother        lung  . Cancer Paternal Grandfather        Bone    Social History   Tobacco Use  . Smoking status: Current Every Day Smoker    Packs/day: 0.25    Types: Cigarettes  . Smokeless tobacco: Never Used  Vaping Use  . Vaping Use: Never used  Substance  Use Topics  . Alcohol use: No  . Drug use: No    Home Medications Prior to Admission medications   Medication Sig Start Date End Date Taking? Authorizing Provider  calcium carbonate (TUMS - DOSED IN MG ELEMENTAL CALCIUM) 500 MG chewable tablet Chew 1 tablet by mouth 2 (two) times daily as needed for indigestion or heartburn. Patient not taking: Reported on 08/27/2019    [provider]  fexofenadine (ALLEGRA) 180 MG tablet Take 180 mg by mouth daily.    [provider]  ibuprofen (ADVIL,MOTRIN) 600 MG tablet Take 1 tablet (600 mg total) by mouth every 6 (six) hours. 05/22/16   Genice Rouge, MD  Prenatal Vit-Fe Fumarate-FA (PNV PRENATAL PLUS MULTIVITAMIN) 27-1 MG TABS Take 1 tablet by mouth daily. Patient not taking: Reported on 08/27/2019 02/12/16    [provider]    Allergies    Patient has no known allergies.  Review of Systems   Review of Systems  Gastrointestinal: Positive for abdominal pain and hematochezia.  All other systems reviewed and are negative.   Physical Exam Updated Vital Signs BP 119/74 (BP Location: Left Arm)   Pulse 64   Temp 97.7 F (36.5 C) (Oral)   Resp 14   Ht 5\' 7"  (1.702 m)   Wt 90.7 kg   LMP 09/28/2019   SpO2 100%   BMI 31.32 kg/m   Physical Exam Vitals and nursing note reviewed. Exam conducted with a chaperone present.  Constitutional:      General: She is not in acute distress.    Appearance: She is well-developed. She is obese.  HENT:     Head: Atraumatic.  Eyes:     Conjunctiva/sclera: Conjunctivae normal.  Cardiovascular:     Rate and Rhythm: Normal rate and regular rhythm.  Pulmonary:     Effort: Pulmonary effort is normal.  Abdominal:     General: Abdomen is flat. Bowel sounds are normal.     Palpations: Abdomen is soft.     Tenderness: There is abdominal tenderness in the right lower quadrant. There is no right CVA tenderness, left CVA tenderness, guarding or rebound. Negative signs include Murphy's sign and McBurney's sign.     Hernia: No hernia is present.  Genitourinary:    Comments: Chaperone was done by 09/30/2019, NT.  Nonthrombosed hemorrhoid visualized.  Internal hemorrhoid palpated.  No obvious mass, no anal fissure, no blood noted on glove, normal rectal tone. Musculoskeletal:     Cervical back: Neck supple.  Skin:    Findings: No rash.  Neurological:     Mental Status: She is alert.  Psychiatric:        Mood and Affect: Mood normal.     ED Results / Procedures / Treatments   Labs (all labs ordered are listed, but only abnormal results are displayed) Labs Reviewed  COMPREHENSIVE METABOLIC PANEL - Abnormal; Notable for the following components:      Result Value   Glucose, Bld 107 (*)    AST 14 (*)    All other components within normal limits    URINALYSIS, ROUTINE W REFLEX MICROSCOPIC - Abnormal; Notable for the following components:   Color, Urine STRAW (*)    All other components within normal limits  CBC  LIPASE, BLOOD  POC OCCULT BLOOD, ED  I-STAT BETA HCG BLOOD, ED ([facility], [facility], [facility] ONLY)  TYPE AND SCREEN    EKG None  Radiology No results found.  Procedures Procedures (including critical care time)  Medications Ordered  in ED Medications  sodium chloride flush (NS) 0.9 % injection 3 mL (has no administration in time range)    ED Course  I have reviewed the triage vital signs and the nursing notes.  Pertinent labs & imaging results that were available during my care of the patient were reviewed by me and considered in my medical decision making (see chart for details).    MDM Rules/Calculators/A&P                          BP 119/74 (BP Location: Left Arm)   Pulse 64   Temp 97.7 F (36.5 C) (Oral)   Resp 14   Ht 5\' 7"  (1.702 m)   Wt 90.7 kg   LMP 09/28/2019   SpO2 100%   BMI 31.32 kg/m   Final Clinical Impression(s) / ED Diagnoses Final diagnoses:  Lower GI bleed    Rx / DC Orders ED Discharge Orders    None     1:22 PM Patient complaining of recurrent right lower quadrant abdominal pain as well as having bright red blood per rectum ongoing for nearly a week.  Recently came off her menstruation and states that she was seen by her OB/GYN for lower abdominal pain within the month and had a negative pelvic ultrasound.  She has no new sexual partner.  She has an intact appendix.  On exam she has minimal diffuse abdominal tenderness without guarding or rebound tenderness.  Her labs are reassuring, UA without signs of urine tract infection, pregnancy test is negative, normal H&H, and fecal occult blood test is negative.  1:31 PM I discussed the lab values with patient as well as discussed the differential diagnosis of rectal bleeding which includes diverticular bleed, hemorrhoidal bleed, IBD, AVM,  malignancy, etc.  At this time we both felt acute abdominal pathology is less likely.  Through shared decision making, we felt the best that moving forward would be to follow-up with GI specialist for further evaluation of her GI bleed.  She may benefit from a colonoscopy if indicated.  Without CT scan is of low yield at this time.  She is stable for discharge.  Return precaution discussed.   09/30/2019, PA-C 10/06/19 1334    12/06/19, MD 10/06/19 1410

## 2020-06-25 ENCOUNTER — Encounter: Payer: Self-pay | Admitting: Radiology

## 2021-01-15 IMAGING — CT CT ANGIO NECK
1 of 11 series · 5 of 33 positions shown · IV contrast (APPLIED)
Comparison: None.

CLINICAL DATA: Episodic vertigo.  Headache.

EXAM:
CT ANGIOGRAPHY HEAD AND NECK
TECHNIQUE: Multidetector CT imaging of the head and neck was performed using
the standard protocol during bolus administration of intravenous
contrast. Multiplanar CT image reconstructions and MIPs were
obtained to evaluate the vascular anatomy. Carotid stenosis
measurements (when applicable) are obtained utilizing NASCET
criteria, using the distal internal carotid diameter as the
denominator.
CONTRAST:  100mL OMNIPAQUE IOHEXOL 350 MG/ML SOLN

[Series 11: ax thins · axial · 0.40mm/px · z∈[-303,-65]mm · 5 of 358 slices shown]
[im 60/358  soft-tissue]
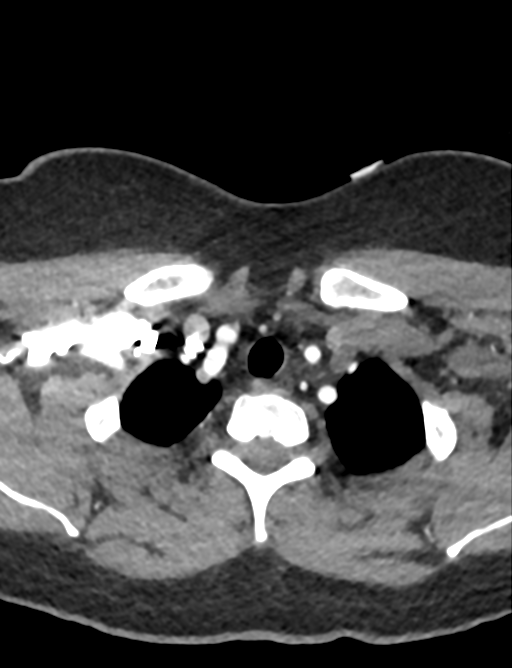
[im 120/358  bone]
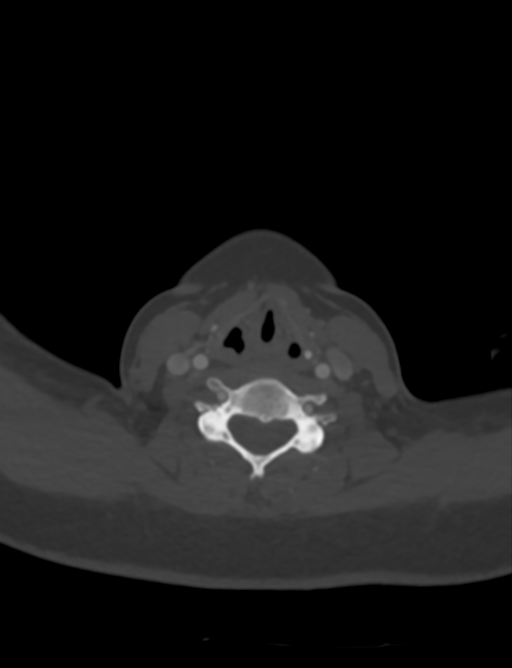
[im 179/358  soft-tissue]
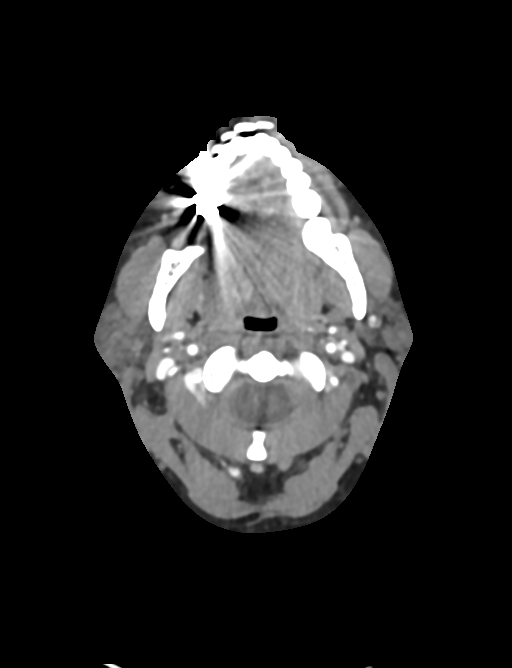
[im 239/358  bone]
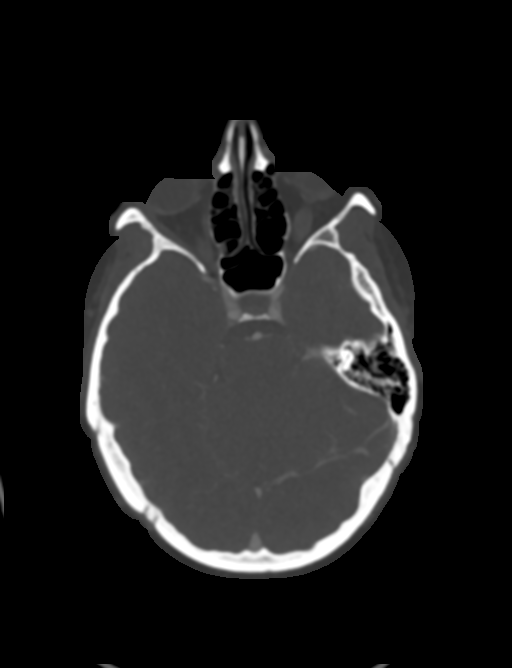
[im 298/358  soft-tissue]
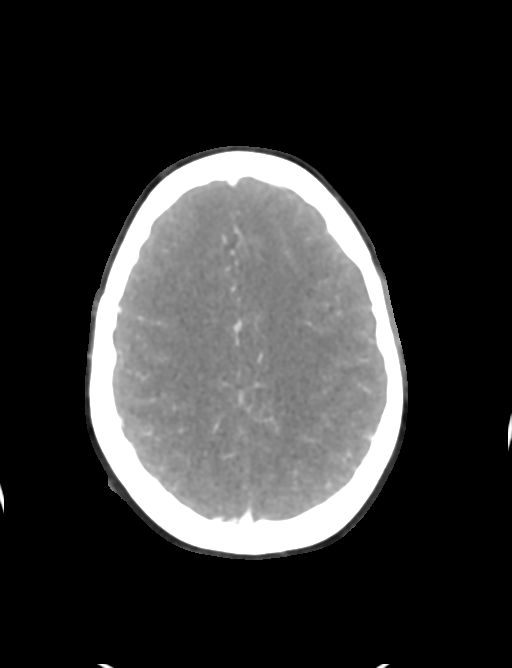

[5 of 33 positions shown; findings below may reference images not displayed]

FINDINGS: CT HEAD FINDINGS

Brain: No evidence of acute infarction, hemorrhage, hydrocephalus,
extra-axial collection or mass lesion/mass effect.

Vascular: Negative for hyperdense vessel

Skull: Negative

Sinuses: Negative

Orbits: Negative

Review of the MIP images confirms the above findings

CTA NECK FINDINGS

Aortic arch: Normal aortic arch. Four vessel arch with left
vertebral origin from the arch. Proximal great vessels widely
patent.

Right carotid system: Normal right carotid. Negative for stenosis or
dissection.

Left carotid system: Normal left carotid. Negative for stenosis or
dissection

Vertebral arteries: Both vertebral arteries are normal bilaterally.

Skeleton: Negative

Other neck: Mild thyroid enlargement.  No mass or adenopathy

Upper chest: Lung apices clear bilaterally

Review of the MIP images confirms the above findings

CTA HEAD FINDINGS

Anterior circulation: Cavernous carotid normal bilaterally without
stenosis or calcification. Anterior and middle cerebral arteries
normal bilaterally.

Posterior circulation: Both vertebral arteries widely patent the
basilar. PICA patent bilaterally. Basilar widely patent. AICA,
superior cerebellar, posterior cerebral arteries patent bilaterally.

Venous sinuses: Normal venous enhancement

Anatomic variants: None

Review of the MIP images confirms the above findings
IMPRESSION: 1. Normal CT head
2. Normal CTA head and neck.

## 2021-11-13 IMAGING — US US PELVIS COMPLETE WITH TRANSVAGINAL
1 series · 14 of 25 positions shown · non-contrast
Comparison: None

CLINICAL DATA: RIGHT lower quadrant pain, LMP 07/31/2019



[Series 1: us pelvis complete with transvaginal · 0.20mm/px · 14 of 66 slices shown]
[im 1/66]
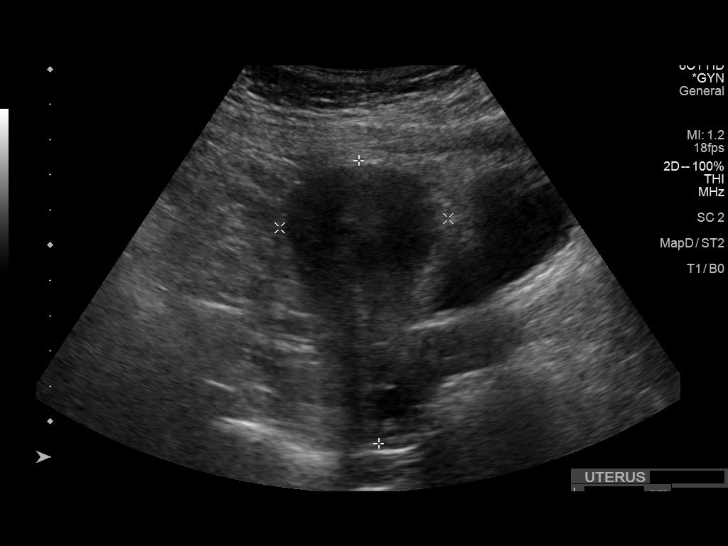
[im 6/66]
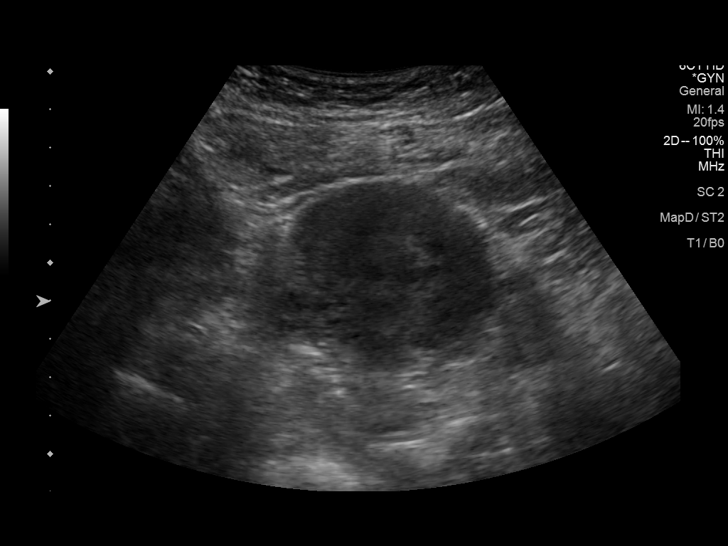
[im 11/66]
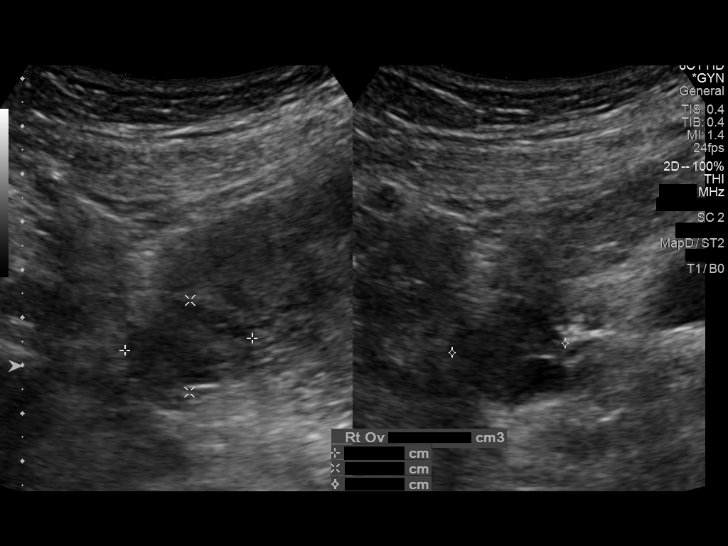
[im 17/66]
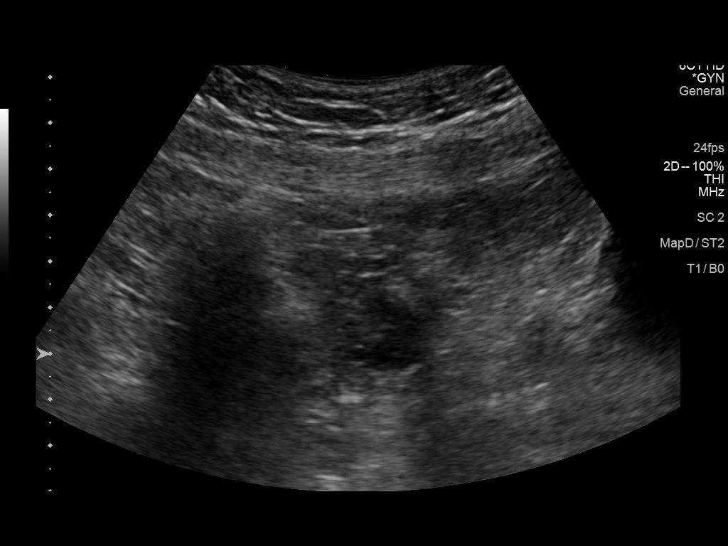
[im 22/66]
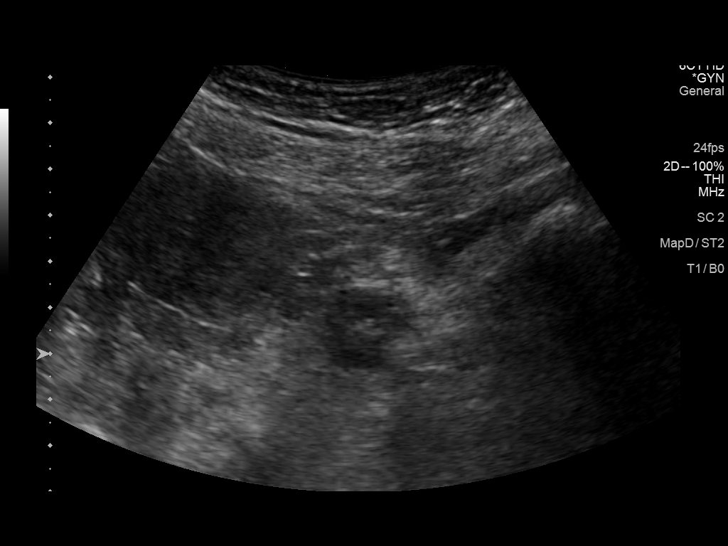
[im 25/66]
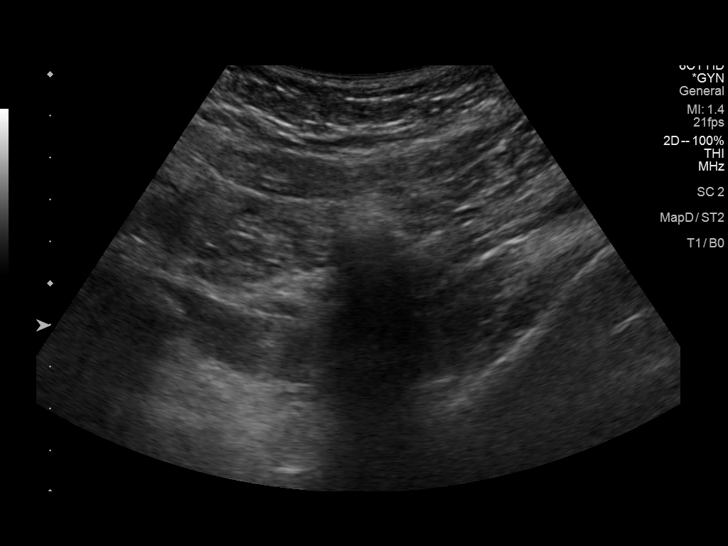
[im 30/66]
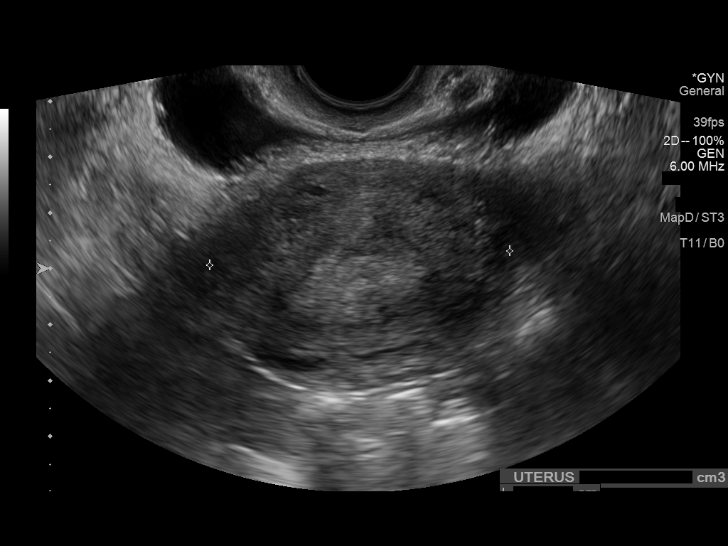
[im 36/66]
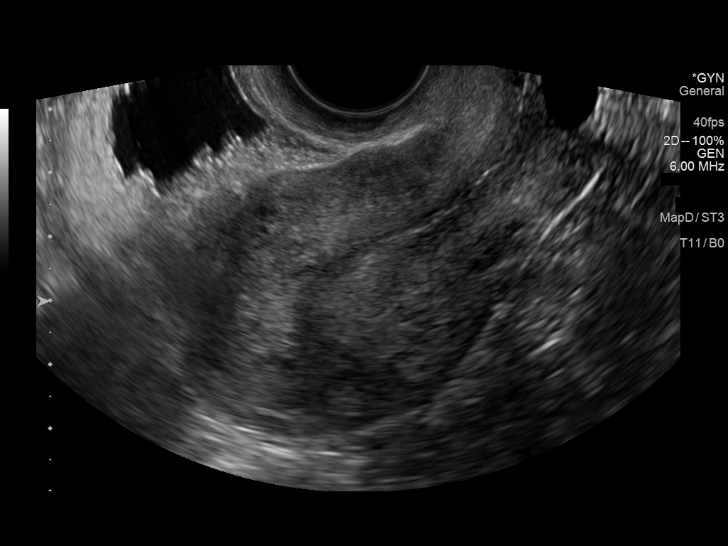
[im 41/66]
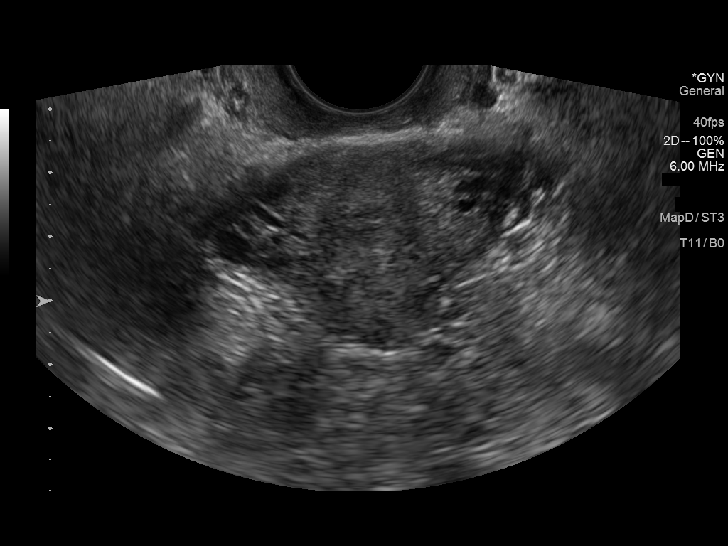
[im 44/66]
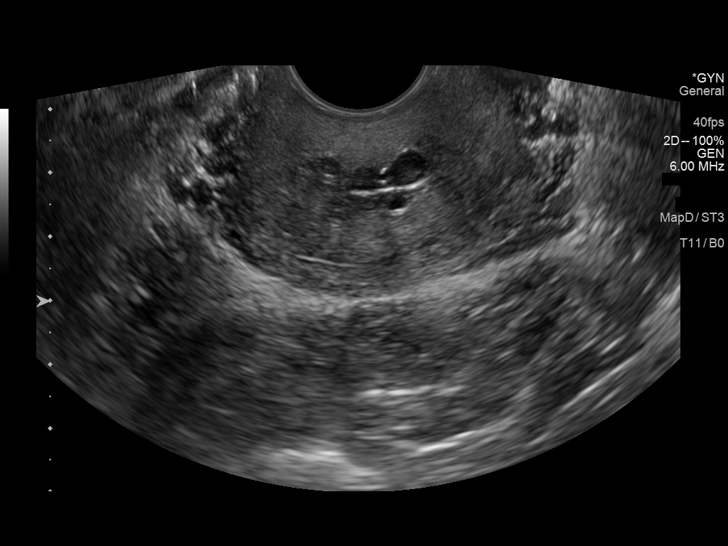
[im 49/66]
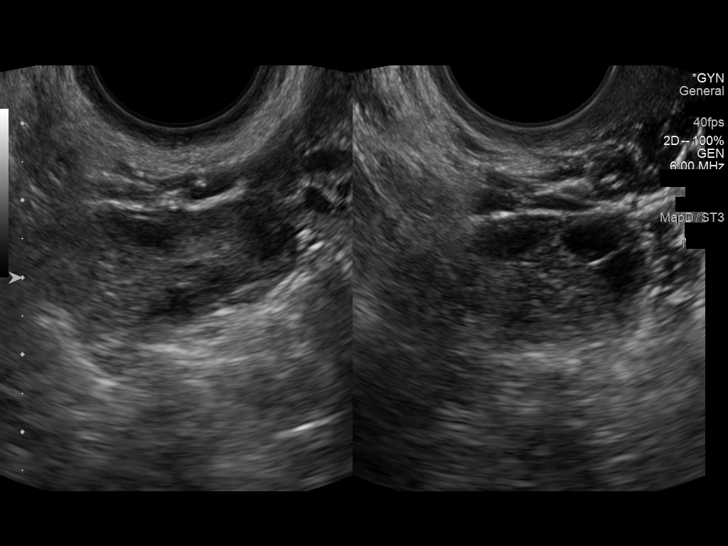
[im 55/66]
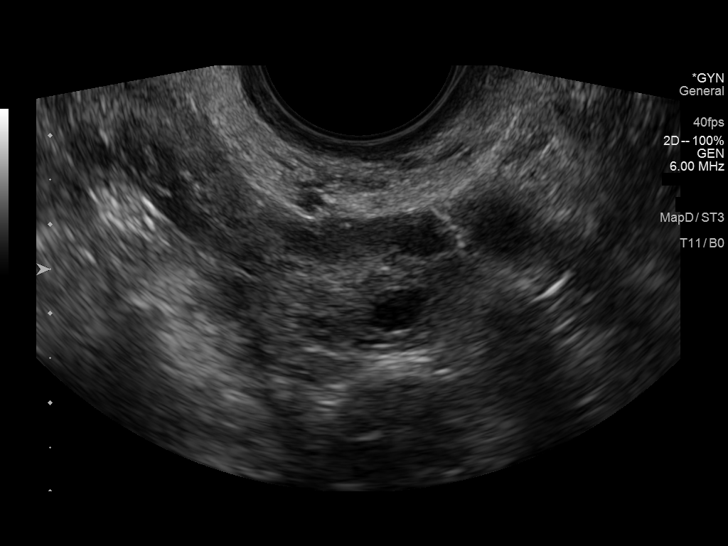
[im 60/66]
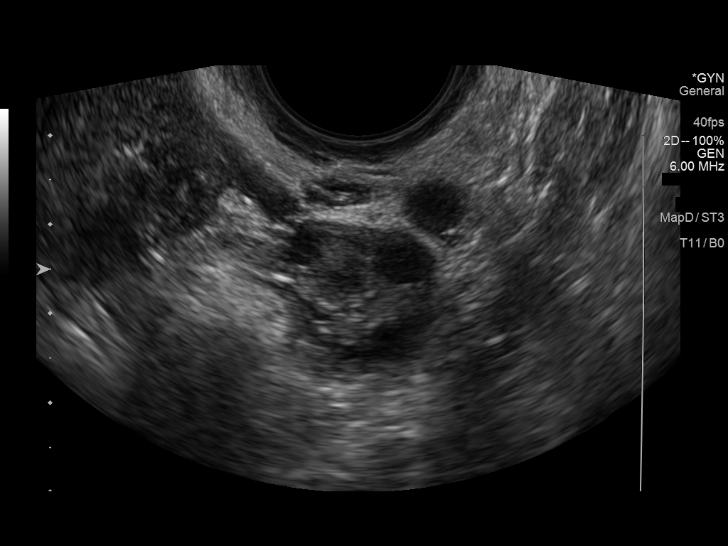
[im 66/66]
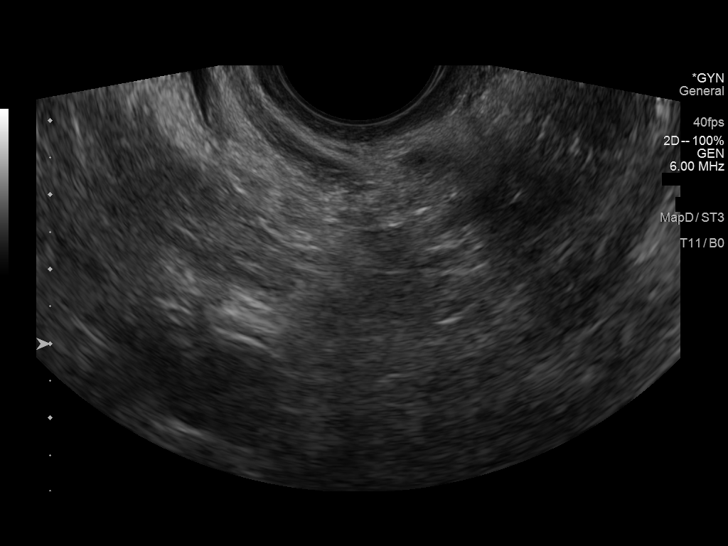

[14 of 25 positions shown; findings below may reference images not displayed]

FINDINGS: Uterus

Measurements: 8.1 x 4.8 x 5.1 cm = volume: 102 mL. Anteverted.
Slightly heterogeneous myometrium. Nabothian cyst at cervix. No
additional mass.

Endometrium

Thickness: 9 mm.  No endometrial fluid or focal abnormality

Right ovary

Measurements: 2.5 x 1.6 x 2.4 cm = volume: 5.0 mL. Normal morphology
without mass

Left ovary

Measurements: 1.7 x 1.8 x 2.0 cm = volume: 3.2 mL. Normal morphology
without mass

Other findings

No free pelvic fluid.  No adnexal masses.
IMPRESSION: Normal exam.

## 2021-11-14 ENCOUNTER — Other Ambulatory Visit: Payer: Self-pay

## 2021-11-14 ENCOUNTER — Encounter (HOSPITAL_BASED_OUTPATIENT_CLINIC_OR_DEPARTMENT_OTHER): Payer: Self-pay

## 2021-11-14 ENCOUNTER — Emergency Department (HOSPITAL_BASED_OUTPATIENT_CLINIC_OR_DEPARTMENT_OTHER)
Admission: EM | Admit: 2021-11-14 | Discharge: 2021-11-14 | Disposition: A | Discharge status: Home / Self Care | Payer: Medicaid Other | Attending: Emergency Medicine | Admitting: Emergency Medicine

## 2021-11-14 ENCOUNTER — Emergency Department (HOSPITAL_BASED_OUTPATIENT_CLINIC_OR_DEPARTMENT_OTHER): Payer: Medicaid Other | Admitting: Radiology

## 2021-11-14 DIAGNOSIS — M25512 Pain in left shoulder: Secondary | ICD-10-CM

## 2021-11-14 MED ORDER — IBUPROFEN 600 MG PO TABS: 600.0000 mg | ORAL_TABLET | Freq: Four times a day (QID) | ORAL | 0 refills | Status: AC | PRN

## 2021-11-14 MED ORDER — KETOROLAC TROMETHAMINE 15 MG/ML IJ SOLN
15.0000 mg | Freq: Once | INTRAMUSCULAR | Status: AC
  Administered 2021-11-14: 15 mg via INTRAMUSCULAR
  Filled 2021-11-14: qty 1

## 2021-11-14 NOTE — ED Triage Notes (Signed)
Pt states she is having left shoulder pain on and off x 2 weeks, worsening lately. Pt has tried heat, massager, biofreeze without relief. Denies any injury,trauma.

## 2021-11-14 NOTE — Discharge Instructions (Signed)
Please take 600 mg of ibuprofen every 6 hours as needed for pain.  You may alternate Tylenol in the 3 to 4-hour mark.  Use sling for comfort.  I will write you a work note for light duty.  If this does not improve in 1 week I would contact orthopedics for further work-up and management.  Please return to the emergency department for any worsening symptoms you might have.

## 2021-11-14 NOTE — ED Provider Notes (Signed)
[facility] EMERGENCY DEPT Provider Note   CSN: [id number] Arrival date & time: 11/14/21  1243     History Chief Complaint  Patient presents with   Shoulder Pain    Jocelyn Potts is a 36 y.o. female patient who presents to the emergency department today for further evaluation of left shoulder pain.  Is been intermittent for the last couple of weeks.  She localizes this pain behind the left shoulder blade and describes it as a burning sensation.  She does a lot of repetitive movements at work at [employer] carrying and moving various items.  It is worsened with those repetitive movements.  It is better with rest.  She denies any weakness or numbness to the left arm.  She denies chest pain or shortness of breath.  Denies fever, chills, injury or trauma to the arm.  She has been using conservative measures at home such as Biofreeze, heat therapy, massage therapy all with little relief.   Shoulder Pain      Home Medications Prior to Admission medications   Medication Sig Start Date End Date Taking? Authorizing Provider  ibuprofen (ADVIL) 600 MG tablet Take 1 tablet (600 mg total) by mouth every 6 (six) hours as needed. 11/14/21  Yes Meredeth Ide, Lennox Dolberry M, PA-C  calcium carbonate (TUMS - DOSED IN MG ELEMENTAL CALCIUM) 500 MG chewable tablet Chew 1 tablet by mouth 2 (two) times daily as needed for indigestion or heartburn. Patient not taking: Reported on 08/27/2019    [provider]  fexofenadine (ALLEGRA) 180 MG tablet Take 180 mg by mouth daily.    [provider]  Prenatal Vit-Fe Fumarate-FA (PNV PRENATAL PLUS MULTIVITAMIN) 27-1 MG TABS Take 1 tablet by mouth daily. Patient not taking: Reported on 08/27/2019 02/12/16   [provider]      Allergies    Patient has no known allergies.    Review of Systems   Review of Systems  All other systems reviewed and are negative.   Physical Exam Updated Vital Signs BP 117/74   Pulse 71   Temp 99.4 F  (37.4 C)   Resp 16   Ht 5\' 7"  (1.702 m)   Wt 90.7 kg   LMP 10/31/2021   SpO2 99%   BMI 31.32 kg/m  Physical Exam Vitals and nursing note reviewed.  Constitutional:      Appearance: Normal appearance.  HENT:     Head: Normocephalic and atraumatic.  Eyes:     General:        Right eye: No discharge.        Left eye: No discharge.     Conjunctiva/sclera: Conjunctivae normal.  Pulmonary:     Effort: Pulmonary effort is normal.  Musculoskeletal:     Comments: Patient has full range of motion in the left arm.  Positive empty can test.  Good internal and external rotation without any evidence of weakness.  Neurologically intact in the arm, wrist, and hand.  Good cap refill.  Strong radial pulse felt on the left.  Good grip strength.  There is point tenderness just medial to the left scapula.  Skin:    General: Skin is warm and dry.     Findings: No rash.  Neurological:     General: No focal deficit present.     Mental Status: She is alert.  Psychiatric:        Mood and Affect: Mood normal.        Behavior: Behavior normal.  ED Results / Procedures / Treatments   Labs (all labs ordered are listed, but only abnormal results are displayed) Labs Reviewed - No data to display  EKG None  Radiology DG Shoulder Left  Result Date: 11/14/2021 CLINICAL DATA:  Intermittent left shoulder pain for the past 2 weeks. EXAM: LEFT SHOULDER - 2+ VIEW COMPARISON:  None Available. FINDINGS: There is no evidence of fracture or dislocation. There is no evidence of arthropathy or other focal bone abnormality. Soft tissues are unremarkable. IMPRESSION: Negative. Electronically Signed   By: Titus Dubin M.D.   On: 11/14/2021 13:33    Procedures Procedures    Medications Ordered in ED Medications  ketorolac (TORADOL) 15 MG/ML injection 15 mg (15 mg Intramuscular Given 11/14/21 1429)    ED Course/ Medical Decision Making/ A&P                           Medical Decision Making Jocelyn Potts is a 36 y.o. female patient who presents to the emergency department today for further evaluation of left shoulder pain.  Patient does have some clinical signs of possible impingement syndrome.  She does not have any clear weakness and I have a low suspicion for full rotator cuff tear at this time.  This could also just be muscular spasm from overuse.  Shoulder x-ray was ordered in triage and interpreted by myself.  I will give her some Toradol for pain in addition to a sling.  I will also place her on an ibuprofen regiment and have her alternate with Tylenol.  She will follow-up with orthopedics.  I will provide a referral.  Strict return precautions were discussed.  We will also write her for light duty at work.  She is safe for discharge.   Amount and/or Complexity of Data Reviewed Radiology: ordered and independent interpretation performed.    Details: I personally interpreted a shoulder x-ray over the left shoulder do not see any evidence of fractures or shoulder dislocation.  I do agree with the radiologist interpretation.  Risk Prescription drug management.    Final Clinical Impression(s) / ED Diagnoses Final diagnoses:  Acute pain of left shoulder    Rx / DC Orders ED Discharge Orders          Ordered    ibuprofen (ADVIL) 600 MG tablet  Every 6 hours PRN        11/14/21 1433              Myna Bright [place], PA-C 11/14/21 1435    Regan Lemming, MD 11/14/21 2304
